# Patient Record
Sex: Female | Born: 1990 | Hispanic: No | Marital: Single | Smoking: Never smoker
Health system: Southern US, Community
[De-identification: ages and names within clinical notes are randomized; demographics above are authoritative.]

## PROBLEM LIST (undated history)

## (undated) HISTORY — PX: NO PAST SURGERIES: SHX2092

---

## 2017-01-08 ENCOUNTER — Ambulatory Visit
Admission: EM | Admit: 2017-01-08 | Discharge: 2017-01-08 | Disposition: A | Discharge status: Home / Self Care | Payer: BLUE CROSS/BLUE SHIELD | Attending: Family Medicine | Admitting: Family Medicine

## 2017-01-08 DIAGNOSIS — R1031 Right lower quadrant pain: Secondary | ICD-10-CM

## 2017-01-08 LAB — URINALYSIS, COMPLETE (UACMP) WITH MICROSCOPIC
BACTERIA UA: NONE SEEN
Bilirubin Urine: NEGATIVE
Glucose, UA: NEGATIVE mg/dL
Hgb urine dipstick: NEGATIVE
Ketones, ur: NEGATIVE mg/dL
Leukocytes, UA: NEGATIVE
NITRITE: NEGATIVE
Protein, ur: NEGATIVE mg/dL
RBC / HPF: NONE SEEN RBC/hpf (ref 0–5)
SPECIFIC GRAVITY, URINE: 1.015 (ref 1.005–1.030)
WBC UA: NONE SEEN WBC/hpf (ref 0–5)
pH: 6.5 (ref 5.0–8.0)

## 2017-01-08 LAB — PREGNANCY, URINE: PREG TEST UR: NEGATIVE

## 2017-01-08 MED ORDER — DICYCLOMINE HCL 20 MG PO TABS: 20.0000 mg | ORAL_TABLET | Freq: Two times a day (BID) | ORAL | 0 refills | Status: DC

## 2017-01-08 NOTE — ED Provider Notes (Signed)
CSN: [id number]     Arrival date & time 01/08/17  1855 History   First MD Initiated Contact with Patient 01/08/17 1934     Chief Complaint  Patient presents with  . Abdominal Pain   (Consider location/radiation/quality/duration/timing/severity/associated sxs/prior Treatment) HPI  26 year old female who presents with bilateral lower pain he has had for about a month. They will come and go sides but is very momentary and never lasts. No nausea vomiting she's had no urinary tract symptoms no vaginal discharge. She's had a normal bowel movement yesterday. Had a  normal period last month and is a little overdue this month but today showed a negative pregnancy test. Denies any fever or chills. She denies any other pain. Noticed some more gas and belching. She tends to eat [employer] frequently. He has not associated the pain with the [employer] food.        History reviewed. No pertinent past medical history. Past Surgical History:  Procedure Laterality Date  . NO PAST SURGERIES     Family History  Problem Relation Age of Onset  . Healthy Mother   . Healthy Father    Social History  Substance Use Topics  . Smoking status: Never Smoker  . Smokeless tobacco: Never Used  . Alcohol use No   OB History    No data available     Review of Systems  Constitutional: Negative for activity change, appetite change, chills, fatigue and fever.  HENT: Negative for congestion.   Gastrointestinal: Positive for abdominal pain. Negative for abdominal distention, anal bleeding, blood in stool, constipation, diarrhea, nausea and vomiting.  Genitourinary: Negative for difficulty urinating, dysuria, menstrual problem, pelvic pain, vaginal bleeding, vaginal discharge and vaginal pain.  Musculoskeletal: Negative for back pain.  All other systems reviewed and are negative.   Allergies  Patient has no known allergies.  Home Medications   Prior to Admission medications   Medication Sig Start Date End  Date Taking? Authorizing Provider  dicyclomine (BENTYL) 20 MG tablet Take 1 tablet (20 mg total) by mouth 2 (two) times daily. 01/08/17   Lutricia Feil, PA-C   Meds Ordered and Administered this Visit  Medications - No data to display  BP 122/73 (BP Location: Left Arm)   Pulse 76   Temp 97.9 F (36.6 C) (Oral)   Resp 16   Ht  (1.575 m)   Wt 130 lb (59 kg)   LMP 12/05/2016   SpO2 100%   BMI 23.78 kg/m  No data found.   Physical Exam  Constitutional: She is oriented to person, place, and time. She appears well-developed and well-nourished. No distress.  HENT:  Head: Normocephalic.  Eyes: Pupils are equal, round, and reactive to light.  Neck: Normal range of motion.  Pulmonary/Chest: Effort normal and breath sounds normal. No respiratory distress. She has no wheezes. She has no rales.  Abdominal: Soft. Bowel sounds are normal. She exhibits no distension and no mass. There is no rebound and no guarding.  The patient was examined with Melissa  ias chaperone and assisted. Patient's tenderness is localized to the right lower quadrant with very deep palpation. Otherwise the abdomen is totally benign.  Musculoskeletal: Normal range of motion.  Neurological: She is alert and oriented to person, place, and time.  Skin: Skin is warm and dry. She is not diaphoretic.  Psychiatric: She has a normal mood and affect. Her behavior is normal. Judgment and thought content normal.  Nursing note and vitals reviewed.  Urgent Care Course     Procedures (including critical care time)  Labs Review Labs Reviewed  URINALYSIS, COMPLETE (UACMP) WITH MICROSCOPIC - Abnormal; Notable for the following:       Result Value   Color, Urine STRAW (*)    Squamous Epithelial / LPF 0-5 (*)    All other components within normal limits  PREGNANCY, URINE    Imaging Review No results found.   Visual Acuity Review  Right Eye Distance:   Left Eye Distance:   Bilateral Distance:    Right Eye  Near:   Left Eye Near:    Bilateral Near:         MDM   1. Right lower quadrant abdominal pain    Discussion with the patient and her mother stating that the exam was benign as was her history. Gaseous foods may be the cause but she has no acute abdomen tonight and further studies are not indicated tonight. I have recommended that she follow-up with a primary care physician and given her several names to contact. If she has worsening of her pain at any time between now and her primary care  Visit, she should go to the emergency room for evaluation.    Lutricia Feil, PA-C 01/08/17 2114

## 2017-01-08 NOTE — ED Triage Notes (Signed)
Patient complains of bilateral lower abdominal pain. Patient states that she has no urinary pain or discomfort, denies any other symptoms.

## 2017-02-13 ENCOUNTER — Encounter: Payer: Self-pay | Admitting: Certified Nurse Midwife

## 2017-02-13 ENCOUNTER — Ambulatory Visit (INDEPENDENT_AMBULATORY_CARE_PROVIDER_SITE_OTHER): Payer: BLUE CROSS/BLUE SHIELD | Admitting: Certified Nurse Midwife

## 2017-02-13 VITALS — BP 116/68 | HR 71 | Ht 62.0 in | Wt 135.6 lb

## 2017-02-13 DIAGNOSIS — Z3201 Encounter for pregnancy test, result positive: Secondary | ICD-10-CM | POA: Diagnosis not present

## 2017-02-13 DIAGNOSIS — Z0000 Encounter for general adult medical examination without abnormal findings: Secondary | ICD-10-CM | POA: Insufficient documentation

## 2017-02-13 DIAGNOSIS — N926 Irregular menstruation, unspecified: Secondary | ICD-10-CM | POA: Diagnosis not present

## 2017-02-13 DIAGNOSIS — Z124 Encounter for screening for malignant neoplasm of cervix: Secondary | ICD-10-CM

## 2017-02-13 LAB — POCT URINE PREGNANCY: Preg Test, Ur: POSITIVE — AB

## 2017-02-13 NOTE — Addendum Note (Signed)
Addended by: Jackquline DenmarkIDGEWAY, Abayomi Pattison W on: 02/13/2017 04:11 PM   Modules accepted: Orders

## 2017-02-13 NOTE — Patient Instructions (Signed)
Preventive Care 18-39 Years, Female Preventive care refers to lifestyle choices and visits with your health care provider that can promote health and wellness. What does preventive care include?  A yearly physical exam. This is also called an annual well check.  Dental exams once or twice a year.  Routine eye exams. Ask your health care provider how often you should have your eyes checked.  Personal lifestyle choices, including:  Daily care of your teeth and gums.  Regular physical activity.  Eating a healthy diet.  Avoiding tobacco and drug use.  Limiting alcohol use.  Practicing safe sex.  Taking vitamin and mineral supplements as recommended by your health care provider. What happens during an annual well check? The services and screenings done by your health care provider during your annual well check will depend on your age, overall health, lifestyle risk factors, and family history of disease. Counseling  Your health care provider may ask you questions about your:  Alcohol use.  Tobacco use.  Drug use.  Emotional well-being.  Home and relationship well-being.  Sexual activity.  Eating habits.  Work and work environment.  Method of birth control.  Menstrual cycle.  Pregnancy history. Screening  You may have the following tests or measurements:  Height, weight, and BMI.  Diabetes screening. This is done by checking your blood sugar (glucose) after you have not eaten for a while (fasting).  Blood pressure.  Lipid and cholesterol levels. These may be checked every 5 years starting at age 20.  Skin check.  Hepatitis C blood test.  Hepatitis B blood test.  Sexually transmitted disease (STD) testing.  BRCA-related cancer screening. This may be done if you have a family history of breast, ovarian, tubal, or peritoneal cancers.  Pelvic exam and Pap test. This may be done every 3 years starting at age 21. Starting at age 30, this may be done every 5  years if you have a Pap test in combination with an HPV test. Discuss your test results, treatment options, and if necessary, the need for more tests with your health care provider. Vaccines  Your health care provider may recommend certain vaccines, such as:  Influenza vaccine. This is recommended every year.  Tetanus, diphtheria, and acellular pertussis (Tdap, Td) vaccine. You may need a Td booster every 10 years.  Varicella vaccine. You may need this if you have not been vaccinated.  HPV vaccine. If you are 26 or younger, you may need three doses over 6 months.  Measles, mumps, and rubella (MMR) vaccine. You may need at least one dose of MMR. You may also need a second dose.  Pneumococcal 13-valent conjugate (PCV13) vaccine. You may need this if you have certain conditions and were not previously vaccinated.  Pneumococcal polysaccharide (PPSV23) vaccine. You may need one or two doses if you smoke cigarettes or if you have certain conditions.  Meningococcal vaccine. One dose is recommended if you are age 19-21 years and a first-year college student living in a residence hall, or if you have one of several medical conditions. You may also need additional booster doses.  Hepatitis A vaccine. You may need this if you have certain conditions or if you travel or work in places where you may be exposed to hepatitis A.  Hepatitis B vaccine. You may need this if you have certain conditions or if you travel or work in places where you may be exposed to hepatitis B.  Haemophilus influenzae type b (Hib) vaccine. You may need this   if you have certain risk factors. Talk to your health care provider about which screenings and vaccines you need and how often you need them. This information is not intended to replace advice given to you by your health care provider. Make sure you discuss any questions you have with your health care provider. Document Released: 10/31/2001 Document Revised: 05/24/2016  Document Reviewed: 07/06/2015 Elsevier Interactive Patient Education  2017 [employer].

## 2017-02-13 NOTE — Progress Notes (Signed)
GYNECOLOGY ANNUAL PREVENTATIVE CARE ENCOUNTER NOTE  Subjective:   Rose Oneal is a 26 y.o. No obstetric history on file. female here for a routine annual gynecologic exam.  Current complaints:None.   Denies abnormal vaginal bleeding, discharge, pelvic pain, problems with intercourse or other gynecologic concerns.  She is currently trying to get pregnant   Gynecologic History Patient's last menstrual period was 01/12/2017 (exact date). Contraception: none Last Pap: N/A.  She has regular cycles every 28-30 days    Obstetric History OB History  No data available    History reviewed. No pertinent past medical history.  Past Surgical History:  Procedure Laterality Date  . NO PAST SURGERIES      Current Outpatient Prescriptions on File Prior to Visit  Medication Sig Dispense Refill  . dicyclomine (BENTYL) 20 MG tablet Take 1 tablet (20 mg total) by mouth 2 (two) times daily. 20 tablet 0   No current facility-administered medications on file prior to visit.     No Known Allergies  Social History   Social History  . Marital status: Single    Spouse name: N/A  . Number of children: N/A  . Years of education: N/A   Occupational History  . Not on file.   Social History Main Topics  . Smoking status: Never Smoker  . Smokeless tobacco: Never Used  . Alcohol use No  . Drug use: No  . Sexual activity: Not on file   Other Topics Concern  . Not on file   Social History Narrative  . No narrative on file    Family History  Problem Relation Age of Onset  . Healthy Mother   . Healthy Father     The following portions of the patient's history were reviewed and updated as appropriate: allergies, current medications, past family history, past medical history, past social history, past surgical history and problem list.  Review of Systems Constitutional: negative Eyes: negative Ears, nose, mouth, throat, and face: negative Respiratory: negative Cardiovascular:  negative Gastrointestinal: negative Genitourinary:negative Integument/breast: negative Hematologic/lymphatic: negative Musculoskeletal:negative Neurological: negative Behavioral/Psych: negative Endocrine: negative Allergic/Immunologic: negative   Objective:  BP 116/68   Pulse 71   Ht 5\' 2"  (1.575 m)   Wt 135 lb 9.6 oz (61.5 kg)   LMP 01/12/2017 (Exact Date)   BMI 24.80 kg/m  CONSTITUTIONAL: Well-developed, well-nourished female in no acute distress.  HENT:  Normocephalic, atraumatic, External right and left ear normal. Oropharynx is clear and moist EYES: Conjunctivae and EOM are normal. Pupils are equal, round, and reactive to light. No scleral icterus.  NECK: Normal range of motion, supple, no masses.  Normal thyroid.  SKIN: Skin is warm and dry. No rash noted. Not diaphoretic. No erythema. No pallor. NEUROLOGIC: Alert and oriented to person, place, and time. Normal reflexes, muscle tone coordination. No cranial nerve deficit noted. PSYCHIATRIC: Normal mood and affect. Normal behavior. Normal judgment and thought content. CARDIOVASCULAR: Normal heart rate noted, regular rhythm RESPIRATORY: Clear to auscultation bilaterally. Effort and breath sounds normal, no problems with respiration noted. BREASTS: Symmetric in size. No masses, skin changes,  or lymphadenopathy. Positive clear nipple discharge on left with breast exam ABDOMEN: Soft, normal bowel sounds, no distention noted.  No tenderness, rebound or guarding.  PELVIC: Normal appearing external genitalia; normal appearing vaginal mucosa and cervix.  No abnormal discharge noted.  Pap smear obtained. uterine size slightly enlarged, no other palpable masses, no uterine or adnexal tenderness. MUSCULOSKELETAL: Normal range of motion. No tenderness.  No cyanosis, clubbing, or edema.  2+ distal pulses.   Assessment:  Annual gynecologic examination with pap smear   Plan:  Will follow up results of pap smear and manage accordingly.  Urine test faint positive. Lidpid profile .Routine preventative health maintenance measures emphasized. Encouraged use of prenatal vitamin. Samples given. Please refer to After Visit Summary for other counseling recommendations.   Doreene BurkeAnnie Vlasta Baskin, CNM

## 2017-02-14 ENCOUNTER — Other Ambulatory Visit: Payer: Self-pay | Admitting: Certified Nurse Midwife

## 2017-02-14 ENCOUNTER — Encounter: Payer: Self-pay | Admitting: Certified Nurse Midwife

## 2017-02-14 DIAGNOSIS — N926 Irregular menstruation, unspecified: Secondary | ICD-10-CM

## 2017-02-14 LAB — LIPID PANEL
CHOL/HDL RATIO: 2.6 ratio (ref 0.0–4.4)
Cholesterol, Total: 193 mg/dL (ref 100–199)
HDL: 73 mg/dL (ref >39)
LDL Calculated: 97 mg/dL (ref 0–99)
Triglycerides: 116 mg/dL (ref 0–149)
VLDL Cholesterol Cal: 23 mg/dL (ref 5–40)

## 2017-02-14 LAB — BETA HCG QUANT (REF LAB): HCG QUANT: 20 m[IU]/mL

## 2017-02-14 NOTE — Progress Notes (Signed)
Beta HCG result 20, follow up for viability of pregnancy.  Rose BurkeAnnie Rakeen Oneal, CNM

## 2017-02-15 LAB — PAP IG W/ RFLX HPV ASCU: PAP Smear Comment: 0

## 2017-02-16 ENCOUNTER — Encounter: Payer: Self-pay | Admitting: Certified Nurse Midwife

## 2017-03-01 ENCOUNTER — Encounter: Payer: Self-pay | Admitting: Certified Nurse Midwife

## 2017-03-05 ENCOUNTER — Ambulatory Visit (INDEPENDENT_AMBULATORY_CARE_PROVIDER_SITE_OTHER): Payer: BLUE CROSS/BLUE SHIELD | Admitting: Certified Nurse Midwife

## 2017-03-05 ENCOUNTER — Ambulatory Visit (INDEPENDENT_AMBULATORY_CARE_PROVIDER_SITE_OTHER): Payer: BLUE CROSS/BLUE SHIELD

## 2017-03-05 VITALS — BP 117/81 | HR 72 | Ht 62.0 in | Wt 135.2 lb

## 2017-03-05 DIAGNOSIS — Z113 Encounter for screening for infections with a predominantly sexual mode of transmission: Secondary | ICD-10-CM

## 2017-03-05 DIAGNOSIS — N926 Irregular menstruation, unspecified: Secondary | ICD-10-CM

## 2017-03-05 DIAGNOSIS — Z1389 Encounter for screening for other disorder: Secondary | ICD-10-CM

## 2017-03-05 DIAGNOSIS — Z3401 Encounter for supervision of normal first pregnancy, first trimester: Secondary | ICD-10-CM

## 2017-03-05 NOTE — Patient Instructions (Signed)
Pregnancy and Zika Virus Disease Zika virus disease, or Zika, is an illness that can spread to people from mosquitoes that carry the virus. It may also spread from person to person through infected body fluids. Zika first occurred in [place], but recently it has spread to new areas. The virus occurs in tropical climates. The location of Zika continues to change. Most people who become infected with Zika virus do not develop serious illness. However, Zika may cause birth defects in an unborn baby whose mother is infected with the virus. It may also increase the risk of miscarriage. What are the symptoms of Zika virus disease? In many cases, people who have been infected with Zika virus do not develop any symptoms. If symptoms appear, they usually start about a week after the person is infected. Symptoms are usually mild. They may include:  Fever.  Rash.  Red eyes.  Joint pain.  How does Zika virus disease spread? The main way that Zika virus spreads is through the bite of a certain type of mosquito. Unlike most types of mosquitos, which bite only at night, the type of mosquito that carries Zika virus bites both at night and during the day. Zika virus can also spread through sexual contact, through a blood transfusion, and from a mother to her baby before or during birth. Once you have had Zika virus disease, it is unlikely that you will get it again. Can I pass Zika to my baby during pregnancy? Yes, Zika can pass from a mother to her baby before or during birth. What problems can Zika cause for my baby? A woman who is infected with Zika virus while pregnant is at risk of having her baby born with a condition in which the brain or head is smaller than expected (microcephaly). Babies who have microcephaly can have developmental delays, seizures, hearing problems, and vision problems. Having Zika virus disease during pregnancy can also increase the risk of miscarriage. How can Zika virus disease be  prevented? There is no vaccine to prevent Zika. The best way to prevent the disease is to avoid infected mosquitoes and avoid exposure to body fluids that can spread the virus. Avoid any possible exposure to Zika by taking the following precautions. For women and their sex partners:  Avoid traveling to high-risk areas. The locations where Zika is being reported change often. To identify high-risk areas, check the CDC travel website: www.cdc.gov/zika/geo/index.html  If you or your sex partner must travel to a high-risk area, talk with a health care provider before and after traveling.  Take all precautions to avoid mosquito bites if you live in, or travel to, any of the high-risk areas. Insect repellents are safe to use during pregnancy.  Ask your health care provider when it is safe to have sexual contact.  For women:  If you are pregnant or trying to become pregnant, avoid sexual contact with persons who may have been exposed to Zika virus, persons who have possible symptoms of Zika, or persons whose history you are unsure about. If you choose to have sexual contact with someone who may have been exposed to Zika virus, use condoms correctly during the entire duration of sexual activity, every time. Do not share sexual devices, as you may be exposed to body fluids.  Ask your health care provider about when it is safe to attempt pregnancy after a possible exposure to Zika virus.  What steps should I take to avoid mosquito bites? Take these steps to avoid mosquito bites   when you are in a high-risk area:  Wear loose clothing that covers your arms and legs.  Limit your outdoor activities.  Do not open windows unless they have window screens.  Sleep under mosquito nets.  Use insect repellent. The best insect repellents have:  DEET, picaridin, oil of lemon eucalyptus (OLE), or IR3535 in them.  Higher amounts of an active ingredient in them.  Remember that insect repellents are safe to  use during pregnancy.  Do not use OLE on children who are younger than 52 years of age. Do not use insect repellent on babies who are younger than 72 months of age.  Cover your child's stroller with mosquito netting. Make sure the netting fits snugly and that any loose netting does not cover your child's mouth or nose. Do not use a blanket as a mosquito-protection cover.  Do not apply insect repellent underneath clothing.  If you are using sunscreen, apply the sunscreen before applying the insect repellent.  Treat clothing with permethrin. Do not apply permethrin directly to your skin. Follow label directions for safe use.  Get rid of standing water, where mosquitoes may reproduce. Standing water is often found in items such as buckets, bowls, animal food dishes, and flowerpots.  When you return from traveling to any high-risk area, continue taking actions to protect yourself against mosquito bites for 3 weeks, even if you show no signs of illness. This will prevent spreading Zika virus to uninfected mosquitoes. What should I know about the sexual transmission of Zika? People can spread Zika to their sexual partners during vaginal, anal, or oral sex, or by sharing sexual devices. Many people with [place] do not develop symptoms, so a person could spread the disease without knowing that they are infected. The greatest risk is to women who are pregnant or who may become pregnant. Zika virus can live longer in semen than it can live in blood. Couples can prevent sexual transmission of the virus by:  Using condoms correctly during the entire duration of sexual activity, every time. This includes vaginal, anal, and oral sex.  Not sharing sexual devices. Sharing increases your risk of being exposed to body fluid from another person.  Avoiding all sexual activity until your health care provider says it is safe.  Should I be tested for Zika virus? A sample of your blood can be tested for Zika virus. A  pregnant woman should be tested if she may have been exposed to the virus or if she has symptoms of Zika. She may also have additional tests done during her pregnancy, such ultrasound testing. Talk with your health care provider about which tests are recommended. This information is not intended to replace advice given to you by your health care provider. Make sure you discuss any questions you have with your health care provider. Document Released: 05/26/2015 Document Revised: 02/10/2016 Document Reviewed: 05/19/2015 Elsevier Interactive Patient Education  2018 [employer]. Hyperemesis Gravidarum Hyperemesis gravidarum is a severe form of nausea and vomiting that happens during pregnancy. Hyperemesis is worse than morning sickness. It may cause you to have nausea or vomiting all day for many days. It may keep you from eating and drinking enough food and liquids. Hyperemesis usually occurs during the first half (the first 20 weeks) of pregnancy. It often goes away once a woman is in her second half of pregnancy. However, sometimes hyperemesis continues through an entire pregnancy. What are the causes? The cause of this condition is not known. It may be related  to changes in chemicals (hormones) in the body during pregnancy, such as the high level of pregnancy hormone (human chorionic gonadotropin) or the increase in the female sex hormone (estrogen). What are the signs or symptoms? Symptoms of this condition include:  Severe nausea and vomiting.  Nausea that does not go away.  Vomiting that does not allow you to keep any food down.  Weight loss.  Body fluid loss (dehydration).  Having no desire to eat, or not liking food that you have previously enjoyed.  How is this diagnosed? This condition may be diagnosed based on:  A physical exam.  Your medical history.  Your symptoms.  Blood tests.  Urine tests.  How is this treated? This condition may be managed with medicine. If  medicines to do not help relieve nausea and vomiting, you may need to receive fluids through an IV tube at the hospital. Follow these instructions at home:  Take over-the-counter and prescription medicines only as told by your health care provider.  Avoid iron pills and multivitamins that contain iron for the first 3-4 months of pregnancy. If you take prescription iron pills, do not stop taking them unless your health care provider approves.  Take the following actions to help prevent nausea and vomiting: ? In the morning, before getting out of bed, try eating a couple of dry crackers or a piece of toast. ? Avoid foods and smells that upset your stomach. Fatty and spicy foods may make nausea worse. ? Eat 5-6 small meals a day. ? Do not drink fluids while eating meals. Drink between meals. ? Eat or suck on things that have ginger in them. Ginger can help relieve nausea. ? Avoid food preparation. The smell of food can spoil your appetite or trigger nausea.  Follow instructions from your health care provider about eating or drinking restrictions.  For snacks, eat high-protein foods, such as cheese.  Keep all follow-up and pre-birth (prenatal) visits as told by your health care provider. This is important. Contact a health care provider if:  You have pain in your abdomen.  You have a severe headache.  You have vision problems.  You are losing weight. Get help right away if:  You cannot drink fluids without vomiting.  You vomit blood.  You have constant nausea and vomiting.  You are very weak.  You are very thirsty.  You feel dizzy.  You faint.  You have a fever or other symptoms that last for more than 2-3 days.  You have a fever and your symptoms suddenly get worse. Summary  Hyperemesis gravidarum is a severe form of nausea and vomiting that happens during pregnancy.  Making some changes to your eating habits may help relieve nausea and vomiting.  This condition may  be managed with medicine.  If medicines to do not help relieve nausea and vomiting, you may need to receive fluids through an IV tube at the hospital. This information is not intended to replace advice given to you by your health care provider. Make sure you discuss any questions you have with your health care provider. Document Released: 09/04/2005 Document Revised: 05/03/2016 Document Reviewed: 05/03/2016 Elsevier Interactive Patient Education  2017 [employer]. Dental Work and Pregnancy Proper dental care before, during, and after pregnancy is important for you and your baby. Pregnancy hormones can sometimes cause the gums to swell, which makes it easier for food to become trapped between teeth. The health of your teeth and gums can affect your growing baby. Dental care  recommendations To help prevent infection and maintain healthy teeth and gums, a thorough oral examination is recommended for all women during the first trimester of pregnancy. Routine cleanings and examinations are recommended throughout pregnancy. Dental care considerations  Tell your dentist if you are pregnant or you plan to become pregnant.  If you are pregnant, avoid routine X-ray exams until after your baby is born. If you are trying to become pregnant, you do not need to avoid X-rays. ? If you need an emergency procedure that includes a dental X-ray exam during pregnancy, very low levels of radiation will be used, and lead aprons can be used to protect you from radiation.  Your dentist will discuss the risks and benefits of having dental procedures during pregnancy. If possible, it is best to have dental procedures (such as cavity fillings and crown repair) during the second trimester of pregnancy or after your baby is born.  If you and your dentist decide to postpone a procedure for any reason, your dentist can recommend treatment to lower the chances of infection until the procedure is performed. This may involve  taking certain medicines that are safe to take during pregnancy, such as penicillin or amoxicillin. Follow these instructions at home:  Practice good oral hygiene habits at home: ? Brush your teeth twice a day with fluoride toothpaste. Brush thoroughly for at least 2 minutes. If you have morning sickness, avoid strongly-flavored toothpastes. ? Floss at least once a day.  Visit your dentist to have regular oral exams and cleanings, and if you experience oral problems.  Eat a well-balanced diet that is low in sugar and carbohydrates.  If you vomit, rinse your mouth with water afterward.  Keep all follow-up visits as told by your dentist. This is important. Seek dental care if:  You develop any of the following oral symptoms or they get worse: ? Pain. ? Bleeding. ? Swelling. ? Inflammation.  You develop growths or swelling between teeth. Get help right away if:  You have a fever or chills. Summary  Proper dental care before, during, and after pregnancy is important for you and your baby.  If you are pregnant, routine X-ray exams should be avoided until after your baby is born.  Your dentist will help you consider the risks and benefits of dental procedures during pregnancy.  You should brush your teeth with fluoride toothpaste twice a day and floss at least once a day. This information is not intended to replace advice given to you by your health care provider. Make sure you discuss any questions you have with your health care provider. Document Released: 02/22/2010 Document Revised: 08/19/2016 Document Reviewed: 08/19/2016 Elsevier Interactive Patient Education  2017 [employer]. First Trimester of Pregnancy The first trimester of pregnancy is from week 1 until the end of week 13 (months 1 through 3). During this time, your baby will begin to develop inside you. At 6-8 weeks, the eyes and face are formed, and the heartbeat can be seen on ultrasound. At the end of 12 weeks, all  the baby's organs are formed. Prenatal care is all the medical care you receive before the birth of your baby. Make sure you get good prenatal care and follow all of your doctor's instructions. Follow these instructions at home: Medicines  Take over-the-counter and prescription medicines only as told by your doctor. Some medicines are safe and some medicines are not safe during pregnancy.  Take a prenatal vitamin that contains at least 600 micrograms (mcg) of folic  acid.  If you have trouble pooping (constipation), take medicine that will make your stool soft (stool softener) if your doctor approves. Eating and drinking  Eat regular, healthy meals.  Your doctor will tell you the amount of weight gain that is right for you.  Avoid raw meat and uncooked cheese.  If you feel sick to your stomach (nauseous) or throw up (vomit): ? Eat 4 or 5 small meals a day instead of 3 large meals. ? Try eating a few soda crackers. ? Drink liquids between meals instead of during meals.  To prevent constipation: ? Eat foods that are high in fiber, like fresh fruits and vegetables, whole grains, and beans. ? Drink enough fluids to keep your pee (urine) clear or pale yellow. Activity  Exercise only as told by your doctor. Stop exercising if you have cramps or pain in your lower belly (abdomen) or low back.  Do not exercise if it is too hot, too humid, or if you are in a place of great height (high altitude).  Try to avoid standing for long periods of time. Move your legs often if you must stand in one place for a long time.  Avoid heavy lifting.  Wear low-heeled shoes. Sit and stand up straight.  You can have sex unless your doctor tells you not to. Relieving pain and discomfort  Wear a good support bra if your breasts are sore.  Take warm water baths (sitz baths) to soothe pain or discomfort caused by hemorrhoids. Use hemorrhoid cream if your doctor says it is okay.  Rest with your legs raised  if you have leg cramps or low back pain.  If you have puffy, bulging veins (varicose veins) in your legs: ? Wear support hose or compression stockings as told by your doctor. ? Raise (elevate) your feet for 15 minutes, 3-4 times a day. ? Limit salt in your food. Prenatal care  Schedule your prenatal visits by the twelfth week of pregnancy.  Write down your questions. Take them to your prenatal visits.  Keep all your prenatal visits as told by your doctor. This is important. Safety  Wear your seat belt at all times when driving.  Make a list of emergency phone numbers. The list should include numbers for family, friends, the hospital, and police and fire departments. General instructions  Ask your doctor for a referral to a local prenatal class. Begin classes no later than at the start of month 6 of your pregnancy.  Ask for help if you need counseling or if you need help with nutrition. Your doctor can give you advice or tell you where to go for help.  Do not use hot tubs, steam rooms, or saunas.  Do not douche or use tampons or scented sanitary pads.  Do not cross your legs for long periods of time.  Avoid all herbs and alcohol. Avoid drugs that are not approved by your doctor.  Do not use any tobacco products, including cigarettes, chewing tobacco, and electronic cigarettes. If you need help quitting, ask your doctor. You may get counseling or other support to help you quit.  Avoid cat litter boxes and soil used by cats. These carry germs that can cause birth defects in the baby and can cause a loss of your baby (miscarriage) or stillbirth.  Visit your dentist. At home, brush your teeth with a soft toothbrush. Be gentle when you floss. Contact a doctor if:  You are dizzy.  You have mild cramps or pressure  in your lower belly.  You have a nagging pain in your belly area.  You continue to feel sick to your stomach, you throw up, or you have watery poop (diarrhea).  You  have a bad smelling fluid coming from your vagina.  You have pain when you pee (urinate).  You have increased puffiness (swelling) in your face, hands, legs, or ankles. Get help right away if:  You have a fever.  You are leaking fluid from your vagina.  You have spotting or bleeding from your vagina.  You have very bad belly cramping or pain.  You gain or lose weight rapidly.  You throw up blood. It may look like coffee grounds.  You are around people who have [place] measles, fifth disease, or chickenpox.  You have a very bad headache.  You have shortness of breath.  You have any kind of trauma, such as from a fall or a car accident. Summary  The first trimester of pregnancy is from week 1 until the end of week 13 (months 1 through 3).  To take care of yourself and your unborn baby, you will need to eat healthy meals, take medicines only if your doctor tells you to do so, and do activities that are safe for you and your baby.  Keep all follow-up visits as told by your doctor. This is important as your doctor will have to ensure that your baby is healthy and growing well. This information is not intended to replace advice given to you by your health care provider. Make sure you discuss any questions you have with your health care provider. Document Released: 02/21/2008 Document Revised: 09/12/2016 Document Reviewed: 09/12/2016 Elsevier Interactive Patient Education  2017 [employer] Commonly Asked Questions During Pregnancy  Cats: A parasite can be excreted in cat feces.  To avoid exposure you need to have another person empty the little box.  If you must empty the litter box you will need to wear gloves.  Wash your hands after handling your cat.  This parasite can also be found in raw or undercooked meat so this should also be avoided.  Colds, Sore Throats, Flu: Please check your medication sheet to see what you can take for symptoms.  If your symptoms are unrelieved by  these medications please call the office.  Dental Work: Most any dental work Agricultural consultantyour dentist recommends is permitted.  X-rays should only be taken during the first trimester if absolutely necessary.  Your abdomen should be shielded with a lead apron during all x-rays.  Please notify your provider prior to receiving any x-rays.  Novocaine is fine; gas is not recommended.  If your dentist requires a note from us prior to dental work please call the office and we will provide one for you.  Exercise: Exercise is an important part of staying healthy during your pregnancy.  You may continue most exercises you were accustomed to prior to pregnancy.  Later in your pregnancy you will most likely notice you have difficulty with activities requiring balance like riding a bicycle.  It is important that you listen to your body and avoid activities that put you at a higher risk of falling.  Adequate rest and staying well hydrated are a must!  If you have questions about the safety of specific activities ask your provider.    Exposure to Children with illness: Try to avoid obvious exposure; report any symptoms to us when noted,  If you have chicken pos, red measles or mumps, you  should be immune to these diseases.   Please do not take any vaccines while pregnant unless you have checked with your OB provider.  Fetal Movement: After 28 weeks we recommend you do "kick counts" twice daily.  Lie or sit down in a calm quiet environment and count your baby movements "kicks".  You should feel your baby at least 10 times per hour.  If you have not felt 10 kicks within the first hour get up, walk around and have something sweet to eat or drink then repeat for an additional hour.  If count remains less than 10 per hour notify your provider.  Fumigating: Follow your pest control agent's advice as to how long to stay out of your home.  Ventilate the area well before re-entering.  Hemorrhoids:   Most over-the-counter preparations can  be used during pregnancy.  Check your medication to see what is safe to use.  It is important to use a stool softener or fiber in your diet and to drink lots of liquids.  If hemorrhoids seem to be getting worse please call the office.   Hot Tubs:  Hot tubs Jacuzzis and saunas are not recommended while pregnant.  These increase your internal body temperature and should be avoided.  Intercourse:  Sexual intercourse is safe during pregnancy as long as you are comfortable, unless otherwise advised by your provider.  Spotting may occur after intercourse; report any bright red bleeding that is heavier than spotting.  Labor:  If you know that you are in labor, please go to the hospital.  If you are unsure, please call the office and let us help you decide what to do.  Lifting, straining, etc:  If your job requires heavy lifting or straining please check with your provider for any limitations.  Generally, you should not lift items heavier than that you can lift simply with your hands and arms (no back muscles)  Painting:  Paint fumes do not harm your pregnancy, but may make you ill and should be avoided if possible.  Latex or water based paints have less odor than oils.  Use adequate ventilation while painting.  Permanents & Hair Color:  Chemicals in hair dyes are not recommended as they cause increase hair dryness which can increase hair loss during pregnancy.  " Highlighting" and permanents are allowed.  Dye may be absorbed differently and permanents may not hold as well during pregnancy.  Sunbathing:  Use a sunscreen, as skin burns easily during pregnancy.  Drink plenty of fluids; avoid over heating.  Tanning Beds:  Because their possible side effects are still unknown, tanning beds are not recommended.  Ultrasound Scans:  Routine ultrasounds are performed at approximately 20 weeks.  You will be able to see your baby's general anatomy an if you would like to know the gender this can usually be  determined as well.  If it is questionable when you conceived you may also receive an ultrasound early in your pregnancy for dating purposes.  Otherwise ultrasound exams are not routinely performed unless there is a medical necessity.  Although you can request a scan we ask that you pay for it when conducted because insurance does not cover " patient request" scans.  Work: If your pregnancy proceeds without complications you may work until your due date, unless your physician or employer advises otherwise.  Round Ligament Pain/Pelvic Discomfort:  Sharp, shooting pains not associated with bleeding are fairly common, usually occurring in the second trimester of pregnancy.  They  tend to be worse when standing up or when you remain standing for long periods of time.  These are the result of pressure of certain pelvic ligaments called "round ligaments".  Rest, Tylenol and heat seem to be the most effective relief.  As the womb and fetus grow, they rise out of the pelvis and the discomfort improves.  Please notify the office if your pain seems different than that described.  It may represent a more serious condition.   

## 2017-03-05 NOTE — Progress Notes (Signed)
Rose CrutchMiranda Hunt presents for NOB nurse interview visit. Pregnancy confirmation done by A. Janee Mornhompson, CNM on 02/13/2017. UPT: positive, bhcg-20. Ultrasound confirmed viable pregnancy today but LMP: 10/19/2016 is not consistent. New EDD: 10/29/2017.  G-1.  P-0000. Pregnancy education material explained and given. No cats in the home. NOB labs ordered.  HIV labs and Drug screen were explained optional and she did not decline. Drug screen ordered. PNV encouraged. Genetic screening options discussed. Genetic testing: Unsure.  Pt may discuss with provider. Pt. To follow up with provider in 5 weeks for NOB physical.  All questions answered. Pt will be moving to [place] in the near future.

## 2017-03-06 LAB — RPR: RPR Ser Ql: NONREACTIVE

## 2017-03-06 LAB — CBC WITH DIFFERENTIAL/PLATELET
BASOS ABS: 0 10*3/uL (ref 0.0–0.2)
Basos: 0 %
EOS (ABSOLUTE): 0 10*3/uL (ref 0.0–0.4)
Eos: 0 %
Hematocrit: 39.4 % (ref 34.0–46.6)
Hemoglobin: 13.2 g/dL (ref 11.1–15.9)
IMMATURE GRANS (ABS): 0 10*3/uL (ref 0.0–0.1)
IMMATURE GRANULOCYTES: 0 %
Lymphocytes Absolute: 1.3 10*3/uL (ref 0.7–3.1)
Lymphs: 14 %
MCH: 29.6 pg (ref 26.6–33.0)
MCHC: 33.5 g/dL (ref 31.5–35.7)
MCV: 88 fL (ref 79–97)
Monocytes Absolute: 0.6 10*3/uL (ref 0.1–0.9)
Monocytes: 7 %
NEUTROS PCT: 79 %
Neutrophils Absolute: 7.2 10*3/uL — ABNORMAL HIGH (ref 1.4–7.0)
PLATELETS: 379 10*3/uL (ref 150–379)
RBC: 4.46 x10E6/uL (ref 3.77–5.28)
RDW: 13.1 % (ref 12.3–15.4)
WBC: 9.3 10*3/uL (ref 3.4–10.8)

## 2017-03-06 LAB — URINALYSIS, ROUTINE W REFLEX MICROSCOPIC
Bilirubin, UA: NEGATIVE
Glucose, UA: NEGATIVE
KETONES UA: NEGATIVE
LEUKOCYTES UA: NEGATIVE
Nitrite, UA: NEGATIVE
Protein, UA: NEGATIVE
RBC, UA: NEGATIVE
SPEC GRAV UA: 1.01 (ref 1.005–1.030)
Urobilinogen, Ur: 0.2 mg/dL (ref 0.2–1.0)
pH, UA: 8 — ABNORMAL HIGH (ref 5.0–7.5)

## 2017-03-06 LAB — MONITOR DRUG PROFILE 14(MW)
AMPHETAMINE SCREEN URINE: NEGATIVE ng/mL
BARBITURATE SCREEN URINE: NEGATIVE ng/mL
BENZODIAZEPINE SCREEN, URINE: NEGATIVE ng/mL
BUPRENORPHINE, URINE: NEGATIVE ng/mL
CANNABINOIDS UR QL SCN: NEGATIVE ng/mL
Cocaine (Metab) Scrn, Ur: NEGATIVE ng/mL
Creatinine(Crt), U: 43.4 mg/dL (ref 20.0–300.0)
Fentanyl, Urine: NEGATIVE pg/mL
Meperidine Screen, Urine: NEGATIVE ng/mL
Methadone Screen, Urine: NEGATIVE ng/mL
OXYCODONE+OXYMORPHONE UR QL SCN: NEGATIVE ng/mL
Opiate Scrn, Ur: NEGATIVE ng/mL
PH UR, DRUG SCRN: 7.2 (ref 4.5–8.9)
PHENCYCLIDINE QUANTITATIVE URINE: NEGATIVE ng/mL
PROPOXYPHENE SCREEN URINE: NEGATIVE ng/mL
SPECIFIC GRAVITY: 1.011
Tramadol Screen, Urine: NEGATIVE ng/mL

## 2017-03-06 LAB — HIV ANTIBODY (ROUTINE TESTING W REFLEX): HIV Screen 4th Generation wRfx: NONREACTIVE

## 2017-03-06 LAB — ANTIBODY SCREEN: ANTIBODY SCREEN: NEGATIVE

## 2017-03-06 LAB — RUBELLA SCREEN: Rubella Antibodies, IGG: 1.02 index (ref >0.99)

## 2017-03-06 LAB — NICOTINE SCREEN, URINE: COTININE UR QL SCN: NEGATIVE ng/mL

## 2017-03-06 LAB — ABO

## 2017-03-06 LAB — HEPATITIS B SURFACE ANTIGEN: HEP B S AG: NEGATIVE

## 2017-03-06 LAB — RH TYPE: RH TYPE: POSITIVE

## 2017-03-06 LAB — VARICELLA ZOSTER ANTIBODY, IGG: Varicella zoster IgG: 1463 index (ref >165)

## 2017-03-07 LAB — CULTURE, OB URINE

## 2017-03-07 LAB — URINE CULTURE, OB REFLEX

## 2017-03-08 LAB — GC/CHLAMYDIA PROBE AMP
Chlamydia trachomatis, NAA: NEGATIVE
NEISSERIA GONORRHOEAE BY PCR: NEGATIVE

## 2017-03-12 ENCOUNTER — Encounter: Payer: Self-pay | Admitting: Certified Nurse Midwife

## 2017-04-02 ENCOUNTER — Encounter: Payer: Self-pay | Admitting: Certified Nurse Midwife

## 2017-04-03 ENCOUNTER — Other Ambulatory Visit: Payer: Self-pay

## 2017-04-03 MED ORDER — PROMETHAZINE HCL 25 MG PO TABS: 25.0000 mg | ORAL_TABLET | Freq: Four times a day (QID) | ORAL | 1 refills | Status: DC | PRN

## 2017-04-09 ENCOUNTER — Ambulatory Visit (INDEPENDENT_AMBULATORY_CARE_PROVIDER_SITE_OTHER): Payer: BLUE CROSS/BLUE SHIELD | Admitting: Certified Nurse Midwife

## 2017-04-09 VITALS — BP 126/69 | HR 90 | Wt 127.8 lb

## 2017-04-09 DIAGNOSIS — Z3401 Encounter for supervision of normal first pregnancy, first trimester: Secondary | ICD-10-CM

## 2017-04-09 LAB — POCT URINALYSIS DIPSTICK
BILIRUBIN UA: NEGATIVE
Glucose, UA: NEGATIVE
Ketones, UA: NEGATIVE
LEUKOCYTES UA: NEGATIVE
NITRITE UA: NEGATIVE
Protein, UA: NEGATIVE
Spec Grav, UA: 1.02 (ref 1.010–1.025)
Urobilinogen, UA: 0.2 E.U./dL
pH, UA: 6 (ref 5.0–8.0)

## 2017-04-09 NOTE — Patient Instructions (Signed)
Common Medications Safe in Pregnancy  Acne:      Constipation:  Benzoyl Peroxide     Colace  Clindamycin      Dulcolax Suppository  Topica Erythromycin     Fibercon  Salicylic Acid      Metamucil         Miralax AVOID:        Senakot   Accutane    Cough:  Retin-A       Cough Drops  Tetracycline      Phenergan w/ Codeine if Rx  Minocycline      Robitussin (Plain & DM)  Antibiotics:     Crabs/Lice:  Ceclor       RID  Cephalosporins    AVOID:  E-Mycins      Kwell  Keflex  Macrobid/Macrodantin   Diarrhea:  Penicillin      Kao-Pectate  Zithromax      Imodium AD         PUSH FLUIDS AVOID:       Cipro     Fever:  Tetracycline      Tylenol (Regular or Extra  Minocycline       Strength)  Levaquin      Extra Strength-Do not          Exceed 8 tabs/24 hrs Caffeine:        <200mg/day (equiv. To 1 cup of coffee or  approx. 3 12 oz sodas)         Gas: Cold/Hayfever:       Gas-X  Benadryl      Mylicon  Claritin       Phazyme  **Claritin-D        Chlor-Trimeton    Headaches:  Dimetapp      ASA-Free Excedrin  Drixoral-Non-Drowsy     Cold Compress  Mucinex (Guaifenasin)     Tylenol (Regular or Extra  Sudafed/Sudafed-12 Hour     Strength)  **Sudafed PE Pseudoephedrine   Tylenol Cold & Sinus     Vicks Vapor Rub  Zyrtec  **AVOID if Problems With Blood Pressure         Heartburn: Avoid lying down for at least 1 hour after meals  Aciphex      Maalox     Rash:  Milk of Magnesia     Benadryl    Mylanta       1% Hydrocortisone Cream  Pepcid  Pepcid Complete   Sleep Aids:  Prevacid      Ambien   Prilosec       Benadryl  Rolaids       Chamomile Tea  Tums (Limit 4/day)     Unisom  Zantac       Tylenol PM         Warm milk-add vanilla or  Hemorrhoids:       Sugar for taste  Anusol/Anusol H.C.  (RX: Analapram 2.5%)  Sugar Substitutes:  Hydrocortisone OTC     Ok in moderation  Preparation H      Tucks        Vaseline lotion applied to tissue with  wiping    Herpes:     Throat:  Acyclovir      Oragel  Famvir  Valtrex     Vaccines:         Flu Shot Leg Cramps:       *Gardasil  Benadryl      Hepatitis A         Hepatitis B Nasal Spray:         Pneumovax  Saline Nasal Spray     Polio Booster         Tetanus Nausea:       Tuberculosis test or PPD  Vitamin B6 25 mg TID   AVOID:    Dramamine      *Gardasil  Emetrol       Live Poliovirus  Ginger Root 250 mg QID    MMR (measles, mumps &  High Complex Carbs @ Bedtime    rebella)  Sea Bands-Accupressure    Varicella (Chickenpox)  Unisom 1/2 tab TID     *No known complications           If received before Pain:         Known pregnancy;   Darvocet       Resume series after  Lortab        Delivery  Percocet    Yeast:   Tramadol      Femstat  Tylenol 3      Gyne-lotrimin  Ultram       Monistat  Vicodin           MISC:         All Sunscreens           Hair Coloring/highlights          Insect Repellant's          (Including DEET)         Mystic Tans How a Baby Grows During Pregnancy Pregnancy begins when a female's sperm enters a female's egg (fertilization). This happens in one of the tubes (fallopian tubes) that connect the ovaries to the womb (uterus). The fertilized egg is called an embryo until it reaches 10 weeks. From 10 weeks until birth, it is called a fetus. The fertilized egg moves down the fallopian tube to the uterus. Then it implants into the lining of the uterus and begins to grow. The developing fetus receives oxygen and nutrients through the pregnant woman's bloodstream and the tissues that grow (placenta) to support the fetus. The placenta is the life support system for the fetus. It provides nutrition and removes waste. Learning as much as you can about your pregnancy and how your baby is developing can help you enjoy the experience. It can also make you aware of when there might be a problem and when to ask questions. How long does a typical pregnancy last? A pregnancy  usually lasts 280 days, or about 40 weeks. Pregnancy is divided into three trimesters:  First trimester: 0-13 weeks.  Second trimester: 14-27 weeks.  Third trimester: 28-40 weeks.  The day when your baby is considered ready to be born (full term) is your estimated date of delivery. How does my baby develop month by month? First month  The fertilized egg attaches to the inside of the uterus.  Some cells will form the placenta. Others will form the fetus.  The arms, legs, brain, spinal cord, lungs, and heart begin to develop.  At the end of the first month, the heart begins to beat.  Second month  The bones, inner ear, eyelids, hands, and feet form.  The genitals develop.  By the end of 8 weeks, all major organs are developing.  Third month  All of the internal organs are forming.  Teeth develop below the gums.  Bones and muscles begin to grow. The spine can flex.  The skin is transparent.  Fingernails and toenails begin to form.  Arms and legs continue to grow  longer, and hands and feet develop.  The fetus is about 3 in (7.6 cm) long.  Fourth month  The placenta is completely formed.  The external sex organs, neck, outer ear, eyebrows, eyelids, and fingernails are formed.  The fetus can hear, swallow, and move its arms and legs.  The kidneys begin to produce urine.  The skin is covered with a white waxy coating (vernix) and very fine hair (lanugo).  Fifth month  The fetus moves around more and can be felt for the first time (quickening).  The fetus starts to sleep and wake up and may begin to suck its finger.  The nails grow to the end of the fingers.  The organ in the digestive system that makes bile (gallbladder) functions and helps to digest the nutrients.  If your baby is a girl, eggs are present in her ovaries. If your baby is a boy, testicles start to move down into his scrotum.  Sixth month  The lungs are formed, but the fetus is not yet  able to breathe.  The eyes open. The brain continues to develop.  Your baby has fingerprints and toe prints. Your baby's hair grows thicker.  At the end of the second trimester, the fetus is about 9 in (22.9 cm) long.  Seventh month  The fetus kicks and stretches.  The eyes are developed enough to sense changes in light.  The hands can make a grasping motion.  The fetus responds to sound.  Eighth month  All organs and body systems are fully developed and functioning.  Bones harden and taste buds develop. The fetus may hiccup.  Certain areas of the brain are still developing. The skull remains soft.  Ninth month  The fetus gains about  lb (0.23 kg) each week.  The lungs are fully developed.  Patterns of sleep develop.  The fetus's head typically moves into a head-down position (vertex) in the uterus to prepare for birth. If the buttocks move into a vertex position instead, the baby is breech.  The fetus weighs 6-9 lbs (2.72-4.08 kg) and is 19-20 in (48.26-50.8 cm) long.  What can I do to have a healthy pregnancy and help my baby develop? Eating and Drinking  Eat a healthy diet. ? Talk with your health care provider to make sure that you are getting the nutrients that you and your baby need. ? Visit www.choosemyplate.gov to learn about creating a healthy diet.  Gain a healthy amount of weight during pregnancy as advised by your health care provider. This is usually 25-35 pounds. You may need to: ? Gain more if you were underweight before getting pregnant or if you are pregnant with more than one baby. ? Gain less if you were overweight or obese when you got pregnant.  Medicines and Vitamins  Take prenatal vitamins as directed by your health care provider. These include vitamins such as folic acid, iron, calcium, and vitamin D. They are important for healthy development.  Take medicines only as directed by your health care provider. Read labels and ask a pharmacist  or your health care provider whether over-the-counter medicines, supplements, and prescription drugs are safe to take during pregnancy.  Activities  Be physically active as advised by your health care provider. Ask your health care provider to recommend activities that are safe for you to do, such as walking or swimming.  Do not participate in strenuous or extreme sports.  Lifestyle  Do not drink alcohol.  Do not use any tobacco   products, including cigarettes, chewing tobacco, or electronic cigarettes. If you need help quitting, ask your health care provider.  Do not use illegal drugs.  Safety  Avoid exposure to mercury, lead, or other heavy metals. Ask your health care provider about common sources of these heavy metals.  Avoid listeria infection during pregnancy. Follow these precautions: ? Do not eat soft cheeses or deli meats. ? Do not eat hot dogs unless they have been warmed up to the point of steaming, such as in the microwave oven. ? Do not drink unpasteurized milk.  Avoid toxoplasmosis infection during pregnancy. Follow these precautions: ? Do not change your cat's litter box, if you have a cat. Ask someone else to do this for you. ? Wear gardening gloves while working in the yard.  General Instructions  Keep all follow-up visits as directed by your health care provider. This is important. This includes prenatal care and screening tests.  Manage any chronic health conditions. Work closely with your health care provider to keep conditions, such as diabetes, under control.  How do I know if my baby is developing well? At each prenatal visit, your health care provider will do several different tests to check on your health and keep track of your baby's development. These include:  Fundal height. ? Your health care provider will measure your growing belly from top to bottom using a tape measure. ? Your health care provider will also feel your belly to determine your  baby's position.  Heartbeat. ? An ultrasound in the first trimester can confirm pregnancy and show a heartbeat, depending on how far along you are. ? Your health care provider will check your baby's heart rate at every prenatal visit. ? As you get closer to your delivery date, you may have regular fetal heart rate monitoring to make sure that your baby is not in distress.  Second trimester ultrasound. ? This ultrasound checks your baby's development. It also indicates your baby's gender.  What should I do if I have concerns about my baby's development? Always talk with your health care provider about any concerns that you may have. This information is not intended to replace advice given to you by your health care provider. Make sure you discuss any questions you have with your health care provider. Document Released: 02/21/2008 Document Revised: 02/10/2016 Document Reviewed: 02/11/2014 Elsevier Interactive Patient Education  2018 [employer]  

## 2017-04-09 NOTE — Progress Notes (Signed)
NEW OB HISTORY AND PHYSICAL  SUBJECTIVE:       Rose Oneal is a 26 y.o. G1P0 female, Patient's last menstrual period was 10/19/2016., Estimated Date of Delivery: 10/29/17, [id number], presents today for establishment of Prenatal Care.She complains of nausea with vomiting for several days . Sample of [place] given.    Gynecologic History Patient's last menstrual period was 10/19/2016. Normal Contraception: none Last Pap: 02/13/17 Results were: normal  Obstetric History OB History  Gravida Para Term Preterm AB Living  1            SAB TAB Ectopic Multiple Live Births               # Outcome Date GA Lbr Len/2nd Weight Sex Delivery Anes PTL Lv  1 Current               No past medical history on file.  Past Surgical History:  Procedure Laterality Date  . NO PAST SURGERIES      Current Outpatient Prescriptions on File Prior to Visit  Medication Sig Dispense Refill  . dicyclomine (BENTYL) 20 MG tablet Take 1 tablet (20 mg total) by mouth 2 (two) times daily. (Patient not taking: Reported on 03/05/2017) 20 tablet 0  . Prenatal Vit-Fe Fumarate-FA (PRENATAL VITAMINS PLUS PO) Take by mouth.    . promethazine (PHENERGAN) 25 MG tablet Take 1 tablet (25 mg total) by mouth every 6 (six) hours as needed for nausea or vomiting. 30 tablet 1   No current facility-administered medications on file prior to visit.     No Known Allergies  Social History   Social History  . Marital status: Single    Spouse name: N/A  . Number of children: N/A  . Years of education: N/A   Occupational History  . Not on file.   Social History Main Topics  . Smoking status: Never Smoker  . Smokeless tobacco: Never Used  . Alcohol use No  . Drug use: No  . Sexual activity: Yes    Partners: Male    Birth control/ protection: None   Other Topics Concern  . Not on file   Social History Narrative  . No narrative on file    Family History  Problem Relation Age of Onset  . Healthy Mother   . Healthy  Father   . Cancer Paternal Grandmother        Lymphoma  . Stroke Paternal Grandmother        great  . Cancer Paternal Aunt        (great) breast cancer    The following portions of the patient's history were reviewed and updated as appropriate: allergies, current medications, past OB history, past medical history, past surgical history, past family history, past social history, and problem list.  OBJECTIVE: Initial Physical Exam (New OB) GENERAL APPEARANCE: alert, well appearing, in no apparent distress, oriented to person, place and time HEAD: normocephalic, atraumatic MOUTH: mucous membranes moist, pharynx normal without lesions THYROID: no thyromegaly or masses present BREASTS: no masses noted, no significant tenderness, no palpable axillary nodes, no skin changes LUNGS: clear to auscultation, no wheezes, rales or rhonchi, symmetric air entry HEART: regular rate and rhythm, no murmurs ABDOMEN: soft, nontender, nondistended, no abnormal masses, no epigastric pain EXTREMITIES: no redness or tenderness in the calves or thighs SKIN: normal coloration and turgor, no rashes LYMPH NODES: no adenopathy palpable NEUROLOGIC: alert, oriented, normal speech, no focal findings or movement disorder noted  PELVIC EXAM EXTERNAL GENITALIA: normal appearing  vulva with no masses, tenderness or lesions VAGINA: no abnormal discharge or lesions CERVIX: no lesions or cervical motion tenderness UTERUS: gravid ADNEXA: no masses palpable and nontender OB EXAM PELVIMETRY: appears adequate RECTUM: exam not indicated  ASSESSMENT: Normal pregnancy  PLAN: New OB counseling: The patient has been given an overview regarding routine prenatal care. Recommendations regarding diet, weight gain, and exercise in pregnancy were given. Prenatal testing, optional genetic testing and carries screening reviewed. PT instructed to schedule appointment in the next 1-2 wks for u/s and blood draw if she would like to have  first trimester screen. Otherwise will follow up if she would like to have AFP, cell free fetal DNA or carrier screening done.  Benefits of Breast Feeding were discussed. The patient is encouraged to consider nursing her baby post partum. Follow up in 4 wks.   Doreene Burke, CNM

## 2017-04-26 ENCOUNTER — Encounter: Payer: Self-pay | Admitting: Certified Nurse Midwife

## 2017-04-27 ENCOUNTER — Other Ambulatory Visit: Payer: Self-pay | Admitting: Certified Nurse Midwife

## 2017-04-27 MED ORDER — ONDANSETRON 4 MG PO TBDP: 4.0000 mg | ORAL_TABLET | Freq: Four times a day (QID) | ORAL | 0 refills | Status: DC | PRN

## 2017-04-30 ENCOUNTER — Encounter: Payer: Self-pay | Admitting: Certified Nurse Midwife

## 2017-05-07 ENCOUNTER — Encounter: Payer: Self-pay | Admitting: Certified Nurse Midwife

## 2017-05-07 ENCOUNTER — Ambulatory Visit (INDEPENDENT_AMBULATORY_CARE_PROVIDER_SITE_OTHER): Payer: BLUE CROSS/BLUE SHIELD | Admitting: Certified Nurse Midwife

## 2017-05-07 VITALS — BP 98/61 | HR 84 | Wt 126.9 lb

## 2017-05-07 DIAGNOSIS — O219 Vomiting of pregnancy, unspecified: Secondary | ICD-10-CM

## 2017-05-07 DIAGNOSIS — Z3689 Encounter for other specified antenatal screening: Secondary | ICD-10-CM

## 2017-05-07 DIAGNOSIS — Z3402 Encounter for supervision of normal first pregnancy, second trimester: Secondary | ICD-10-CM

## 2017-05-07 LAB — POCT URINALYSIS DIPSTICK
BILIRUBIN UA: NEGATIVE
GLUCOSE UA: NEGATIVE
Ketones, UA: NEGATIVE
Leukocytes, UA: NEGATIVE
Nitrite, UA: NEGATIVE
Protein, UA: NEGATIVE
SPEC GRAV UA: 1.015 (ref 1.010–1.025)
Urobilinogen, UA: 0.2 E.U./dL
pH, UA: 7 (ref 5.0–8.0)

## 2017-05-07 NOTE — Patient Instructions (Signed)

## 2017-05-07 NOTE — Progress Notes (Signed)
ROB- no complaints. Unsure of 2nd trimester screen.

## 2017-05-11 ENCOUNTER — Encounter: Payer: Self-pay | Admitting: Certified Nurse Midwife

## 2017-05-11 ENCOUNTER — Other Ambulatory Visit: Payer: Self-pay | Admitting: Certified Nurse Midwife

## 2017-05-11 MED ORDER — ONDANSETRON 4 MG PO TBDP: 4.0000 mg | ORAL_TABLET | Freq: Four times a day (QID) | ORAL | 0 refills | Status: AC | PRN

## 2017-05-11 NOTE — Progress Notes (Unsigned)
Pt requesting refill for zofran.  Doreene Burke, CNM

## 2017-05-13 MED ORDER — PROMETHAZINE HCL 25 MG RE SUPP: 25.0000 mg | Freq: Four times a day (QID) | RECTAL | 0 refills | Status: DC | PRN

## 2017-05-13 NOTE — Progress Notes (Signed)
ROB-Reports nausea with intermittent vomiting. Taking Zofran. Encouraged patient to maximize usage. Rx Phenergan, see orders. Declines second trimester screen at time time. Patient relocating to [facility]. Advised location of new provider. List of practice locations given: [facility], [facility], [facility], and [facility]. Encouraged patient to transfer care prior to [redacted] weeks gestation. Reviewed red flag symptoms and when to call. RTC x 4-5 weeks for anatomy scan and ROB.

## 2017-06-08 ENCOUNTER — Ambulatory Visit (INDEPENDENT_AMBULATORY_CARE_PROVIDER_SITE_OTHER): Payer: BLUE CROSS/BLUE SHIELD | Admitting: Certified Nurse Midwife

## 2017-06-08 ENCOUNTER — Ambulatory Visit (INDEPENDENT_AMBULATORY_CARE_PROVIDER_SITE_OTHER): Payer: BLUE CROSS/BLUE SHIELD

## 2017-06-08 VITALS — BP 127/66 | HR 86 | Wt 125.9 lb

## 2017-06-08 DIAGNOSIS — Z3689 Encounter for other specified antenatal screening: Secondary | ICD-10-CM

## 2017-06-08 DIAGNOSIS — Z3402 Encounter for supervision of normal first pregnancy, second trimester: Secondary | ICD-10-CM | POA: Diagnosis not present

## 2017-06-08 DIAGNOSIS — Z3492 Encounter for supervision of normal pregnancy, unspecified, second trimester: Secondary | ICD-10-CM

## 2017-06-08 LAB — POCT URINALYSIS DIPSTICK
Bilirubin, UA: NEGATIVE
Glucose, UA: NEGATIVE
Ketones, UA: NEGATIVE
LEUKOCYTES UA: NEGATIVE
NITRITE UA: NEGATIVE
PH UA: 6 (ref 5.0–8.0)
Protein, UA: NEGATIVE
RBC UA: NEGATIVE
Spec Grav, UA: 1.015 (ref 1.010–1.025)
UROBILINOGEN UA: 0.2 U/dL

## 2017-06-08 NOTE — Patient Instructions (Signed)
Round Ligament Pain The round ligament is a cord of muscle and tissue that helps to support the uterus. It can become a source of pain during pregnancy if it becomes stretched or twisted as the baby grows. The pain usually begins in the second trimester of pregnancy, and it can come and go until the baby is delivered. It is not a serious problem, and it does not cause harm to the baby. Round ligament pain is usually a short, sharp, and pinching pain, but it can also be a dull, lingering, and aching pain. The pain is felt in the lower side of the abdomen or in the groin. It usually starts deep in the groin and moves up to the outside of the hip area. Pain can occur with:  A sudden change in position.  Rolling over in bed.  Coughing or sneezing.  Physical activity.  Follow these instructions at home: Watch your condition for any changes. Take these steps to help with your pain:  When the pain starts, relax. Then try: ? Sitting down. ? Flexing your knees up to your abdomen. ? Lying on your side with one pillow under your abdomen and another pillow between your legs. ? Sitting in a warm bath for 15-20 minutes or until the pain goes away.  Take over-the-counter and prescription medicines only as told by your health care provider.  Move slowly when you sit and stand.  Avoid long walks if they cause pain.  Stop or lessen your physical activities if they cause pain.  Contact a health care provider if:  Your pain does not go away with treatment.  You feel pain in your back that you did not have before.  Your medicine is not helping. Get help right away if:  You develop a fever or chills.  You develop uterine contractions.  You develop vaginal bleeding.  You develop nausea or vomiting.  You develop diarrhea.  You have pain when you urinate. This information is not intended to replace advice given to you by your health care provider. Make sure you discuss any questions you have  with your health care provider. Document Released: 06/13/2008 Document Revised: 02/10/2016 Document Reviewed: 11/11/2014 Elsevier Interactive Patient Education  2018 [employer] How a Baby Grows During Pregnancy Pregnancy begins when a female's sperm enters a female's egg (fertilization). This happens in one of the tubes (fallopian tubes) that connect the ovaries to the womb (uterus). The fertilized egg is called an embryo until it reaches 10 weeks. From 10 weeks until birth, it is called a fetus. The fertilized egg moves down the fallopian tube to the uterus. Then it implants into the lining of the uterus and begins to grow. The developing fetus receives oxygen and nutrients through the pregnant woman's bloodstream and the tissues that grow (placenta) to support the fetus. The placenta is the life support system for the fetus. It provides nutrition and removes waste. Learning as much as you can about your pregnancy and how your baby is developing can help you enjoy the experience. It can also make you aware of when there might be a problem and when to ask questions. How long does a typical pregnancy last? A pregnancy usually lasts 280 days, or about 40 weeks. Pregnancy is divided into three trimesters:  First trimester: 0-13 weeks.  Second trimester: 14-27 weeks.  Third trimester: 28-40 weeks.  The day when your baby is considered ready to be born (full term) is your estimated date of delivery. How does   my baby develop month by month? First month  The fertilized egg attaches to the inside of the uterus.  Some cells will form the placenta. Others will form the fetus.  The arms, legs, brain, spinal cord, lungs, and heart begin to develop.  At the end of the first month, the heart begins to beat.  Second month  The bones, inner ear, eyelids, hands, and feet form.  The genitals develop.  By the end of 8 weeks, all major organs are developing.  Third month  All of the internal  organs are forming.  Teeth develop below the gums.  Bones and muscles begin to grow. The spine can flex.  The skin is transparent.  Fingernails and toenails begin to form.  Arms and legs continue to grow longer, and hands and feet develop.  The fetus is about 3 in (7.6 cm) long.  Fourth month  The placenta is completely formed.  The external sex organs, neck, outer ear, eyebrows, eyelids, and fingernails are formed.  The fetus can hear, swallow, and move its arms and legs.  The kidneys begin to produce urine.  The skin is covered with a white waxy coating (vernix) and very fine hair (lanugo).  Fifth month  The fetus moves around more and can be felt for the first time (quickening).  The fetus starts to sleep and wake up and may begin to suck its finger.  The nails grow to the end of the fingers.  The organ in the digestive system that makes bile (gallbladder) functions and helps to digest the nutrients.  If your baby is a girl, eggs are present in her ovaries. If your baby is a boy, testicles start to move down into his scrotum.  Sixth month  The lungs are formed, but the fetus is not yet able to breathe.  The eyes open. The brain continues to develop.  Your baby has fingerprints and toe prints. Your baby's hair grows thicker.  At the end of the second trimester, the fetus is about 9 in (22.9 cm) long.  Seventh month  The fetus kicks and stretches.  The eyes are developed enough to sense changes in light.  The hands can make a grasping motion.  The fetus responds to sound.  Eighth month  All organs and body systems are fully developed and functioning.  Bones harden and taste buds develop. The fetus may hiccup.  Certain areas of the brain are still developing. The skull remains soft.  Ninth month  The fetus gains about  lb (0.23 kg) each week.  The lungs are fully developed.  Patterns of sleep develop.  The fetus's head typically moves into a  head-down position (vertex) in the uterus to prepare for birth. If the buttocks move into a vertex position instead, the baby is breech.  The fetus weighs 6-9 lbs (2.72-4.08 kg) and is 19-20 in (48.26-50.8 cm) long.  What can I do to have a healthy pregnancy and help my baby develop? Eating and Drinking  Eat a healthy diet. ? Talk with your health care provider to make sure that you are getting the nutrients that you and your baby need. ? Visit www.choosemyplate.gov to learn about creating a healthy diet.  Gain a healthy amount of weight during pregnancy as advised by your health care provider. This is usually 25-35 pounds. You may need to: ? Gain more if you were underweight before getting pregnant or if you are pregnant with more than one baby. ? Gain less   if you were overweight or obese when you got pregnant.  Medicines and Vitamins  Take prenatal vitamins as directed by your health care provider. These include vitamins such as folic acid, iron, calcium, and vitamin D. They are important for healthy development.  Take medicines only as directed by your health care provider. Read labels and ask a pharmacist or your health care provider whether over-the-counter medicines, supplements, and prescription drugs are safe to take during pregnancy.  Activities  Be physically active as advised by your health care provider. Ask your health care provider to recommend activities that are safe for you to do, such as walking or swimming.  Do not participate in strenuous or extreme sports.  Lifestyle  Do not drink alcohol.  Do not use any tobacco products, including cigarettes, chewing tobacco, or electronic cigarettes. If you need help quitting, ask your health care provider.  Do not use illegal drugs.  Safety  Avoid exposure to mercury, lead, or other heavy metals. Ask your health care provider about common sources of these heavy metals.  Avoid listeria infection during pregnancy. Follow  these precautions: ? Do not eat soft cheeses or deli meats. ? Do not eat hot dogs unless they have been warmed up to the point of steaming, such as in the microwave oven. ? Do not drink unpasteurized milk.  Avoid toxoplasmosis infection during pregnancy. Follow these precautions: ? Do not change your cat's litter box, if you have a cat. Ask someone else to do this for you. ? Wear gardening gloves while working in the yard.  General Instructions  Keep all follow-up visits as directed by your health care provider. This is important. This includes prenatal care and screening tests.  Manage any chronic health conditions. Work closely with your health care provider to keep conditions, such as diabetes, under control.  How do I know if my baby is developing well? At each prenatal visit, your health care provider will do several different tests to check on your health and keep track of your baby's development. These include:  Fundal height. ? Your health care provider will measure your growing belly from top to bottom using a tape measure. ? Your health care provider will also feel your belly to determine your baby's position.  Heartbeat. ? An ultrasound in the first trimester can confirm pregnancy and show a heartbeat, depending on how far along you are. ? Your health care provider will check your baby's heart rate at every prenatal visit. ? As you get closer to your delivery date, you may have regular fetal heart rate monitoring to make sure that your baby is not in distress.  Second trimester ultrasound. ? This ultrasound checks your baby's development. It also indicates your baby's gender.  What should I do if I have concerns about my baby's development? Always talk with your health care provider about any concerns that you may have. This information is not intended to replace advice given to you by your health care provider. Make sure you discuss any questions you have with your health  care provider. Document Released: 02/21/2008 Document Revised: 02/10/2016 Document Reviewed: 02/11/2014 Elsevier Interactive Patient Education  2018 [employer]  

## 2017-06-08 NOTE — Progress Notes (Signed)
ROB- pt is doing well, had anatomy scan done today

## 2017-06-08 NOTE — Progress Notes (Signed)
  ROB and anatomy scan today. Scan was normal and complete ( see below). Reviewed results with pt. She will follow up in 4 wks.   Doreene Burke, CNM ULTRASOUND REPORT  Location: [facility] Date of Service: 06/08/17  Indications:Anatomy U/S Findings:  Mason Jim intrauterine pregnancy is visualized with FHR at 143 BPM. Biometrics give an (U/S) Gestational age of 80 6/7 weeks and an (U/S) EDD of 10/27/17; this correlates with the clinically established EDD of 10/29/17.  Fetal presentation is Vertex.  EFW: 318g (11oz). Placenta: Fundal, grade 0, 6.9 cm from internal os. AFI: Adequate with MVP of 3.9 cm.  Anatomic survey is complete and normal; Gender - female  .   Ovaries are not seen Survey of the adnexa demonstrates no adnexal masses. There is no free peritoneal fluid in the cul de sac.  Impression: 1. 19 6/7 week Viable Singleton Intrauterine pregnancy by U/S. 2. (U/S) EDD is consistent with Clinically established (LMP) EDD of 10/29/17. 3. Normal Anatomy Scan  Recommendations: 1.Clinical correlation with the patient's History and Physical Exam.   Boyce Medici

## 2017-06-15 ENCOUNTER — Telehealth: Payer: Self-pay | Admitting: Certified Nurse Midwife

## 2017-06-15 NOTE — Telephone Encounter (Signed)
Called patient, verified full name and date of birth.   Patient relocating to [place] and needs advice on practices in town. Patient will be living in the [place] area. Information given for [facility] and [facility]. Encouraged patient to contact office and schedule appointment then complete records release form.   Call back with further needs, questions, or concerns.    Serafina Royals, CNM

## 2017-07-06 ENCOUNTER — Ambulatory Visit (INDEPENDENT_AMBULATORY_CARE_PROVIDER_SITE_OTHER): Payer: BLUE CROSS/BLUE SHIELD | Admitting: Obstetrics and Gynecology

## 2017-07-06 VITALS — BP 112/73 | HR 79 | Wt 129.0 lb

## 2017-07-06 DIAGNOSIS — Z23 Encounter for immunization: Secondary | ICD-10-CM | POA: Diagnosis not present

## 2017-07-06 DIAGNOSIS — Z3492 Encounter for supervision of normal pregnancy, unspecified, second trimester: Secondary | ICD-10-CM

## 2017-07-06 LAB — POCT URINALYSIS DIPSTICK
BILIRUBIN UA: NEGATIVE
Blood, UA: NEGATIVE
GLUCOSE UA: NEGATIVE
Ketones, UA: NEGATIVE
LEUKOCYTES UA: NEGATIVE
Nitrite, UA: NEGATIVE
PH UA: 7.5 (ref 5.0–8.0)
Protein, UA: NEGATIVE
Spec Grav, UA: 1.01 (ref 1.010–1.025)
Urobilinogen, UA: 0.2 E.U./dL

## 2017-07-06 NOTE — Progress Notes (Signed)
ROB- pt is doing ok, having some nausea, flu vaccine given

## 2017-07-06 NOTE — Progress Notes (Signed)
ROB- flu vaccine given,still nauseated- bonjesta sample given.already moved to [place] and is transferring care there.

## 2017-07-17 ENCOUNTER — Encounter: Payer: Self-pay | Admitting: Certified Nurse Midwife

## 2017-08-03 ENCOUNTER — Ambulatory Visit (INDEPENDENT_AMBULATORY_CARE_PROVIDER_SITE_OTHER): Payer: BLUE CROSS/BLUE SHIELD | Admitting: Certified Nurse Midwife

## 2017-08-03 ENCOUNTER — Other Ambulatory Visit: Payer: BLUE CROSS/BLUE SHIELD

## 2017-08-03 VITALS — BP 114/76 | HR 84 | Wt 129.4 lb

## 2017-08-03 DIAGNOSIS — Z130 Encounter for screening for diseases of the blood and blood-forming organs and certain disorders involving the immune mechanism: Secondary | ICD-10-CM

## 2017-08-03 DIAGNOSIS — Z131 Encounter for screening for diabetes mellitus: Secondary | ICD-10-CM

## 2017-08-03 DIAGNOSIS — Z3492 Encounter for supervision of normal pregnancy, unspecified, second trimester: Secondary | ICD-10-CM

## 2017-08-03 DIAGNOSIS — Z23 Encounter for immunization: Secondary | ICD-10-CM

## 2017-08-03 LAB — POCT URINALYSIS DIPSTICK
Bilirubin, UA: NEGATIVE
GLUCOSE UA: NEGATIVE
Ketones, UA: NEGATIVE
Leukocytes, UA: NEGATIVE
Nitrite, UA: NEGATIVE
Protein, UA: NEGATIVE
RBC UA: NEGATIVE
SPEC GRAV UA: 1.015 (ref 1.010–1.025)
UROBILINOGEN UA: 0.2 U/dL
pH, UA: 6.5 (ref 5.0–8.0)

## 2017-08-03 NOTE — Patient Instructions (Signed)
Common Medications Safe in Pregnancy  Acne:      Constipation:  Benzoyl Peroxide     Colace  Clindamycin      Dulcolax Suppository  Topica Erythromycin     Fibercon  Salicylic Acid      Metamucil         Miralax AVOID:        Senakot   Accutane    Cough:  Retin-A       Cough Drops  Tetracycline      Phenergan w/ Codeine if Rx  Minocycline      Robitussin (Plain & DM)  Antibiotics:     Crabs/Lice:  Ceclor       RID  Cephalosporins    AVOID:  E-Mycins      Kwell  Keflex  Macrobid/Macrodantin   Diarrhea:  Penicillin      Kao-Pectate  Zithromax      Imodium AD         PUSH FLUIDS AVOID:       Cipro     Fever:  Tetracycline      Tylenol (Regular or Extra  Minocycline       Strength)  Levaquin      Extra Strength-Do not          Exceed 8 tabs/24 hrs Caffeine:        <200mg/day (equiv. To 1 cup of coffee or  approx. 3 12 oz sodas)         Gas: Cold/Hayfever:       Gas-X  Benadryl      Mylicon  Claritin       Phazyme  **Claritin-D        Chlor-Trimeton    Headaches:  Dimetapp      ASA-Free Excedrin  Drixoral-Non-Drowsy     Cold Compress  Mucinex (Guaifenasin)     Tylenol (Regular or Extra  Sudafed/Sudafed-12 Hour     Strength)  **Sudafed PE Pseudoephedrine   Tylenol Cold & Sinus     Vicks Vapor Rub  Zyrtec  **AVOID if Problems With Blood Pressure         Heartburn: Avoid lying down for at least 1 hour after meals  Aciphex      Maalox     Rash:  Milk of Magnesia     Benadryl    Mylanta       1% Hydrocortisone Cream  Pepcid  Pepcid Complete   Sleep Aids:  Prevacid      Ambien   Prilosec       Benadryl  Rolaids       Chamomile Tea  Tums (Limit 4/day)     Unisom  Zantac       Tylenol PM         Warm milk-add vanilla or  Hemorrhoids:       Sugar for taste  Anusol/Anusol H.C.  (RX: Analapram 2.5%)  Sugar Substitutes:  Hydrocortisone OTC     Ok in moderation  Preparation H      Tucks        Vaseline lotion applied to tissue with  wiping    Herpes:     Throat:  Acyclovir      Oragel  Famvir  Valtrex     Vaccines:         Flu Shot Leg Cramps:       *Gardasil  Benadryl      Hepatitis A         Hepatitis B Nasal Spray:         Pneumovax  Saline Nasal Spray     Polio Booster         Tetanus Nausea:       Tuberculosis test or PPD  Vitamin B6 25 mg TID   AVOID:    Dramamine      *Gardasil  Emetrol       Live Poliovirus  Ginger Root 250 mg QID    MMR (measles, mumps &  High Complex Carbs @ Bedtime    rebella)  Sea Bands-Accupressure    Varicella (Chickenpox)  Unisom 1/2 tab TID     *No known complications           If received before Pain:         Known pregnancy;   Darvocet       Resume series after  Lortab        Delivery  Percocet    Yeast:   Tramadol      Femstat  Tylenol 3      Gyne-lotrimin  Ultram       Monistat  Vicodin           MISC:         All Sunscreens           Hair Coloring/highlights          Insect Repellant's          (Including DEET)         [employer] Cord Blood Banking Information Cord blood banking is the process of collecting and storing the blood that is in the umbilical cord and placenta at the time of delivery. This blood contains stem cells, which can be used to treat many blood diseases, immune system disorders, and childhood cancers. Stem cells can also be used to research certain diseases and treatments. Many people who choose cord blood banking donate the blood. Donated blood can be used in lifesaving treatments or for research. Other people choose to store the blood privately. Blood that is stored privately can only be used with the person's permission. This option is often chosen if:  A family member needs a stem cell transplant.  The child is part of an ethnic minority.  The child was conceived through in vitro fertilization.  What should I look for in a blood bank? A blood bank is the organization that coordinates cord blood banking. Make sure the cord blood bank that  you use:  Is accredited.  Is financially stable.  Handles a large volume of cord blood samples.  Has a procedure in place for transport and storage.  Allows you the option of transferring your cord blood sample.  Has a procedure in place if the bank goes out of business.  Clearly states all costs and limits to future costs.  People who choose to donate cord blood should not need to pay for blood banking. People who keep the blood for private use will need to pay for the first (initial) storage and pay a fee each year (annual fee). Other fees may also apply. What are the risks of cord blood banking? There are no health risks associated with cord blood banking. It is considered safe. How should I prepare? You must schedule this process at least 4-6 weeks before you will be giving birth. How is the blood collected? The blood is collected as soon as the baby has been delivered. Within 15 minutes of delivery, a health care provider will take these actions to collect the blood:  Clamp the  umbilical cord at the top and bottom. This traps the blood in the umbilical cord.  Use a syringe or bag to collect the blood.  Insert needles into the placenta to collect (draw out) more blood.  What happens after the blood is collected? After the blood has been collected:  The blood will be sent to a blood bank.  The blood will be tested for genetic problems and infectious diseases. If the blood tests positive for a genetic problem or a disease, someone will contact you and let you know.  The blood will be frozen.  If your child develops a genetic condition, immune system disorder, or cancer, you will be responsible for contacting the blood bank and letting them know. This information is not intended to replace advice given to you by your health care provider. Make sure you discuss any questions you have with your health care provider. Document Released: 02/22/2010 Document Revised: 02/10/2016  Document Reviewed: 02/22/2015 Elsevier Interactive Patient Education  2018 [employer]. Breastfeeding Deciding to breastfeed is one of the best choices you can make for you and your baby. A change in hormones during pregnancy causes your breast tissue to grow and increases the number and size of your milk ducts. These hormones also allow proteins, sugars, and fats from your blood supply to make breast milk in your milk-producing glands. Hormones prevent breast milk from being released before your baby is born as well as prompt milk flow after birth. Once breastfeeding has begun, thoughts of your baby, as well as his or her sucking or crying, can stimulate the release of milk from your milk-producing glands. Benefits of breastfeeding For Your Baby  Your first milk (colostrum) helps your baby's digestive system function better.  There are antibodies in your milk that help your baby fight off infections.  Your baby has a lower incidence of asthma, allergies, and sudden infant death syndrome.  The nutrients in breast milk are better for your baby than infant formulas and are designed uniquely for your baby's needs.  Breast milk improves your baby's brain development.  Your baby is less likely to develop other conditions, such as childhood obesity, asthma, or type 2 diabetes mellitus.  For You  Breastfeeding helps to create a very special bond between you and your baby.  Breastfeeding is convenient. Breast milk is always available at the correct temperature and costs nothing.  Breastfeeding helps to burn calories and helps you lose the weight gained during pregnancy.  Breastfeeding makes your uterus contract to its prepregnancy size faster and slows bleeding (lochia) after you give birth.  Breastfeeding helps to lower your risk of developing type 2 diabetes mellitus, osteoporosis, and breast or ovarian cancer later in life.  Signs that your baby is hungry Early Signs of  Hunger  Increased alertness or activity.  Stretching.  Movement of the head from side to side.  Movement of the head and opening of the mouth when the corner of the mouth or cheek is stroked (rooting).  Increased sucking sounds, smacking lips, cooing, sighing, or squeaking.  Hand-to-mouth movements.  Increased sucking of fingers or hands.  Late Signs of Hunger  Fussing.  Intermittent crying.  Extreme Signs of Hunger Signs of extreme hunger will require calming and consoling before your baby will be able to breastfeed successfully. Do not wait for the following signs of extreme hunger to occur before you initiate breastfeeding:  Restlessness.  A loud, strong cry.  Screaming.  Breastfeeding basics Breastfeeding Initiation  Find a  comfortable place to sit or lie down, with your neck and back well supported.  Place a pillow or rolled up blanket under your baby to bring him or her to the level of your breast (if you are seated). Nursing pillows are specially designed to help support your arms and your baby while you breastfeed.  Make sure that your baby's abdomen is facing your abdomen.  Gently massage your breast. With your fingertips, massage from your chest wall toward your nipple in a circular motion. This encourages milk flow. You may need to continue this action during the feeding if your milk flows slowly.  Support your breast with 4 fingers underneath and your thumb above your nipple. Make sure your fingers are well away from your nipple and your baby's mouth.  Stroke your baby's lips gently with your finger or nipple.  When your baby's mouth is open wide enough, quickly bring your baby to your breast, placing your entire nipple and as much of the colored area around your nipple (areola) as possible into your baby's mouth. ? More areola should be visible above your baby's upper lip than below the lower lip. ? Your baby's tongue should be between his or her lower gum  and your breast.  Ensure that your baby's mouth is correctly positioned around your nipple (latched). Your baby's lips should create a seal on your breast and be turned out (everted).  It is common for your baby to suck about 2-3 minutes in order to start the flow of breast milk.  Latching Teaching your baby how to latch on to your breast properly is very important. An improper latch can cause nipple pain and decreased milk supply for you and poor weight gain in your baby. Also, if your baby is not latched onto your nipple properly, he or she may swallow some air during feeding. This can make your baby fussy. Burping your baby when you switch breasts during the feeding can help to get rid of the air. However, teaching your baby to latch on properly is still the best way to prevent fussiness from swallowing air while breastfeeding. Signs that your baby has successfully latched on to your nipple:  Silent tugging or silent sucking, without causing you pain.  Swallowing heard between every 3-4 sucks.  Muscle movement above and in front of his or her ears while sucking.  Signs that your baby has not successfully latched on to nipple:  Sucking sounds or smacking sounds from your baby while breastfeeding.  Nipple pain.  If you think your baby has not latched on correctly, slip your finger into the corner of your baby's mouth to break the suction and place it between your baby's gums. Attempt breastfeeding initiation again. Signs of Successful Breastfeeding Signs from your baby:  A gradual decrease in the number of sucks or complete cessation of sucking.  Falling asleep.  Relaxation of his or her body.  Retention of a small amount of milk in his or her mouth.  Letting go of your breast by himself or herself.  Signs from you:  Breasts that have increased in firmness, weight, and size 1-3 hours after feeding.  Breasts that are softer immediately after breastfeeding.  Increased milk  volume, as well as a change in milk consistency and color by the fifth day of breastfeeding.  Nipples that are not sore, cracked, or bleeding.  Signs That Your [employer] is Getting Enough Milk  Wetting at least 1-2 diapers during the first 24 hours after  birth.  Wetting at least 5-6 diapers every 24 hours for the first week after birth. The urine should be clear or pale yellow by 5 days after birth.  Wetting 6-8 diapers every 24 hours as your baby continues to grow and develop.  At least 3 stools in a 24-hour period by age 67 days. The stool should be soft and yellow.  At least 3 stools in a 24-hour period by age 51 days. The stool should be seedy and yellow.  No loss of weight greater than 10% of birth weight during the first 77 days of age.  Average weight gain of 4-7 ounces (113-198 g) per week after age 106 days.  Consistent daily weight gain by age 679 days, without weight loss after the age of 2 weeks.  After a feeding, your baby may spit up a small amount. This is common. Breastfeeding frequency and duration Frequent feeding will help you make more milk and can prevent sore nipples and breast engorgement. Breastfeed when you feel the need to reduce the fullness of your breasts or when your baby shows signs of hunger. This is called "breastfeeding on demand." Avoid introducing a pacifier to your baby while you are working to establish breastfeeding (the first 4-6 weeks after your baby is born). After this time you may choose to use a pacifier. Research has shown that pacifier use during the first year of a baby's life decreases the risk of sudden infant death syndrome (SIDS). Allow your baby to feed on each breast as long as he or she wants. Breastfeed until your baby is finished feeding. When your baby unlatches or falls asleep while feeding from the first breast, offer the second breast. Because newborns are often sleepy in the first few weeks of life, you may need to awaken your baby to get him  or her to feed. Breastfeeding times will vary from baby to baby. However, the following rules can serve as a guide to help you ensure that your baby is properly fed:  Newborns (babies 47 weeks of age or younger) may breastfeed every 1-3 hours.  Newborns should not go longer than 3 hours during the day or 5 hours during the night without breastfeeding.  You should breastfeed your baby a minimum of 8 times in a 24-hour period until you begin to introduce solid foods to your baby at around 43 months of age.  Breast milk pumping Pumping and storing breast milk allows you to ensure that your baby is exclusively fed your breast milk, even at times when you are unable to breastfeed. This is especially important if you are going back to work while you are still breastfeeding or when you are not able to be present during feedings. Your lactation consultant can give you guidelines on how long it is safe to store breast milk. A breast pump is a machine that allows you to pump milk from your breast into a sterile bottle. The pumped breast milk can then be stored in a refrigerator or freezer. Some breast pumps are operated by hand, while others use electricity. Ask your lactation consultant which type will work best for you. Breast pumps can be purchased, but some hospitals and breastfeeding support groups lease breast pumps on a monthly basis. A lactation consultant can teach you how to hand express breast milk, if you prefer not to use a pump. Caring for your breasts while you breastfeed Nipples can become dry, cracked, and sore while breastfeeding. The following recommendations can help  keep your breasts moisturized and healthy:  Avoid using soap on your nipples.  Wear a supportive bra. Although not required, special nursing bras and tank tops are designed to allow access to your breasts for breastfeeding without taking off your entire bra or top. Avoid wearing underwire-style bras or extremely tight  bras.  Air dry your nipples for 3-67mnutes after each feeding.  Use only cotton bra pads to absorb leaked breast milk. Leaking of breast milk between feedings is normal.  Use lanolin on your nipples after breastfeeding. Lanolin helps to maintain your skin's normal moisture barrier. If you use pure lanolin, you do not need to wash it off before feeding your baby again. Pure lanolin is not toxic to your baby. You may also hand express a few drops of breast milk and gently massage that milk into your nipples and allow the milk to air dry.  In the first few weeks after giving birth, some women experience extremely full breasts (engorgement). Engorgement can make your breasts feel heavy, warm, and tender to the touch. Engorgement peaks within 3-5 days after you give birth. The following recommendations can help ease engorgement:  Completely empty your breasts while breastfeeding or pumping. You may want to start by applying warm, moist heat (in the shower or with warm water-soaked hand towels) just before feeding or pumping. This increases circulation and helps the milk flow. If your baby does not completely empty your breasts while breastfeeding, pump any extra milk after he or she is finished.  Wear a snug bra (nursing or regular) or tank top for 1-2 days to signal your body to slightly decrease milk production.  Apply ice packs to your breasts, unless this is too uncomfortable for you.  Make sure that your baby is latched on and positioned properly while breastfeeding.  If engorgement persists after 48 hours of following these recommendations, contact your health care provider or a lScience writer Overall health care recommendations while breastfeeding  Eat healthy foods. Alternate between meals and snacks, eating 3 of each per day. Because what you eat affects your breast milk, some of the foods may make your baby more irritable than usual. Avoid eating these foods if you are sure that  they are negatively affecting your baby.  Drink milk, fruit juice, and water to satisfy your thirst (about 10 glasses a day).  Rest often, relax, and continue to take your prenatal vitamins to prevent fatigue, stress, and anemia.  Continue breast self-awareness checks.  Avoid chewing and smoking tobacco. Chemicals from cigarettes that pass into breast milk and exposure to secondhand smoke may harm your baby.  Avoid alcohol and drug use, including marijuana. Some medicines that may be harmful to your baby can pass through breast milk. It is important to ask your health care provider before taking any medicine, including all over-the-counter and prescription medicine as well as vitamin and herbal supplements. It is possible to become pregnant while breastfeeding. If birth control is desired, ask your health care provider about options that will be safe for your baby. Contact a health care provider if:  You feel like you want to stop breastfeeding or have become frustrated with breastfeeding.  You have painful breasts or nipples.  Your nipples are cracked or bleeding.  Your breasts are red, tender, or warm.  You have a swollen area on either breast.  You have a fever or chills.  You have nausea or vomiting.  You have drainage other than breast milk from your nipples.  Your breasts do not become full before feedings by the fifth day after you give birth.  You feel sad and depressed.  Your baby is too sleepy to eat well.  Your baby is having trouble sleeping.  Your baby is wetting less than 3 diapers in a 24-hour period.  Your baby has less than 3 stools in a 24-hour period.  Your baby's skin or the white part of his or her eyes becomes yellow.  Your baby is not gaining weight by 53 days of age. Get help right away if:  Your baby is overly tired (lethargic) and does not want to wake up and feed.  Your baby develops an unexplained fever. This information is not intended to  replace advice given to you by your health care provider. Make sure you discuss any questions you have with your health care provider. Document Released: 09/04/2005 Document Revised: 02/16/2016 Document Reviewed: 02/26/2013 Elsevier Interactive Patient Education  2017 [place]. Vaginal Delivery Vaginal delivery means that you will give birth by pushing your baby out of your birth canal (vagina). A team of health care providers will help you before, during, and after vaginal delivery. Birth experiences are unique for every woman and every pregnancy, and birth experiences vary depending on where you choose to give birth. What should I do to prepare for my baby's birth? Before your baby is born, it is important to talk with your health care provider about:  Your labor and delivery preferences. These may include: ? Medicines that you may be given. ? How you will manage your pain. This might include non-medical pain relief techniques or injectable pain relief such as epidural analgesia. ? How you and your baby will be monitored during labor and delivery. ? Who may be in the labor and delivery room with you. ? Your feelings about surgical delivery of your baby (cesarean delivery, or C-section) if this becomes necessary. ? Your feelings about receiving donated blood through an IV tube (blood transfusion) if this becomes necessary.  Whether you are able: ? To take pictures or videos of the birth. ? To eat during labor and delivery. ? To move around, walk, or change positions during labor and delivery.  What to expect after your baby is born, such as: ? Whether delayed umbilical cord clamping and cutting is offered. ? Who will care for your baby right after birth. ? Medicines or tests that may be recommended for your baby. ? Whether breastfeeding is supported in your hospital or birth center. ? How long you will be in the hospital or birth center.  How any medical conditions you have may  affect your baby or your labor and delivery experience.  To prepare for your baby's birth, you should also:  Attend all of your health care visits before delivery (prenatal visits) as recommended by your health care provider. This is important.  Prepare your home for your baby's arrival. Make sure that you have: ? Diapers. ? Baby clothing. ? Feeding equipment. ? Safe sleeping arrangements for you and your baby.  Install a car seat in your vehicle. Have your car seat checked by a certified car seat installer to make sure that it is installed safely.  Think about who will help you with your new baby at home for at least the first several weeks after delivery.  What can I expect when I arrive at the birth center or hospital? Once you are in labor and have been admitted into the hospital or birth  center, your health care provider may:  Review your pregnancy history and any concerns you have.  Insert an IV tube into one of your veins. This is used to give you fluids and medicines.  Check your blood pressure, pulse, temperature, and heart rate (vital signs).  Check whether your bag of water (amniotic sac) has broken (ruptured).  Talk with you about your birth plan and discuss pain control options.  Monitoring Your health care provider may monitor your contractions (uterine monitoring) and your baby's heart rate (fetal monitoring). You may need to be monitored:  Often, but not continuously (intermittently).  All the time or for long periods at a time (continuously). Continuous monitoring may be needed if: ? You are taking certain medicines, such as medicine to relieve pain or make your contractions stronger. ? You have pregnancy or labor complications.  Monitoring may be done by:  Placing a special stethoscope or a handheld monitoring device on your abdomen to check your baby's heartbeat, and feeling your abdomen for contractions. This method of monitoring does not continuously  record your baby's heartbeat or your contractions.  Placing monitors on your abdomen (external monitors) to record your baby's heartbeat and the frequency and length of contractions. You may not have to wear external monitors all the time.  Placing monitors inside of your uterus (internal monitors) to record your baby's heartbeat and the frequency, length, and strength of your contractions. ? Your health care provider may use internal monitors if he or she needs more information about the strength of your contractions or your baby's heart rate. ? Internal monitors are put in place by passing a thin, flexible wire through your vagina and into your uterus. Depending on the type of monitor, it may remain in your uterus or on your baby's head until birth. ? Your health care provider will discuss the benefits and risks of internal monitoring with you and will ask for your permission before inserting the monitors.  Telemetry. This is a type of continuous monitoring that can be done with external or internal monitors. Instead of having to stay in bed, you are able to move around during telemetry. Ask your health care provider if telemetry is an option for you.  Physical exam Your health care provider may perform a physical exam. This may include:  Checking whether your baby is positioned: ? With the head toward your vagina (head-down). This is most common. ? With the head toward the top of your uterus (head-up or breech). If your baby is in a breech position, your health care provider may try to turn your baby to a head-down position so you can deliver vaginally. If it does not seem that your baby can be born vaginally, your provider may recommend surgery to deliver your baby. In rare cases, you may be able to deliver vaginally if your baby is head-up (breech delivery). ? Lying sideways (transverse). Babies that are lying sideways cannot be delivered vaginally.  Checking your cervix to  determine: ? Whether it is thinning out (effacing). ? Whether it is opening up (dilating). ? How low your baby has moved into your birth canal.  What are the three stages of labor and delivery?  Normal labor and delivery is divided into the following three stages: Stage 1  Stage 1 is the longest stage of labor, and it can last for hours or days. Stage 1 includes: ? Early labor. This is when contractions may be irregular, or regular and mild. Generally, early labor contractions  are more than 10 minutes apart. ? Active labor. This is when contractions get longer, more regular, more frequent, and more intense. ? The transition phase. This is when contractions happen very close together, are very intense, and may last longer than during any other part of labor.  Contractions generally feel mild, infrequent, and irregular at first. They get stronger, more frequent (about every 2-3 minutes), and more regular as you progress from early labor through active labor and transition.  Many women progress through stage 1 naturally, but you may need help to continue making progress. If this happens, your health care provider may talk with you about: ? Rupturing your amniotic sac if it has not ruptured yet. ? Giving you medicine to help make your contractions stronger and more frequent.  Stage 1 ends when your cervix is completely dilated to 4 inches (10 cm) and completely effaced. This happens at the end of the transition phase. Stage 2  Once your cervix is completely effaced and dilated to 4 inches (10 cm), you may start to feel an urge to push. It is common for the body to naturally take a rest before feeling the urge to push, especially if you received an epidural or certain other pain medicines. This rest period may last for up to 1-2 hours, depending on your unique labor experience.  During stage 2, contractions are generally less painful, because pushing helps relieve contraction pain. Instead of  contraction pain, you may feel stretching and burning pain, especially when the widest part of your baby's head passes through the vaginal opening (crowning).  Your health care provider will closely monitor your pushing progress and your baby's progress through the vagina during stage 2.  Your health care provider may massage the area of skin between your vaginal opening and anus (perineum) or apply warm compresses to your perineum. This helps it stretch as the baby's head starts to crown, which can help prevent perineal tearing. ? In some cases, an incision may be made in your perineum (episiotomy) to allow the baby to pass through the vaginal opening. An episiotomy helps to make the opening of the vagina larger to allow more room for the baby to fit through.  It is very important to breathe and focus so your health care provider can control the delivery of your baby's head. Your health care provider may have you decrease the intensity of your pushing, to help prevent perineal tearing.  After delivery of your baby's head, the shoulders and the rest of the body generally deliver very quickly and without difficulty.  Once your baby is delivered, the umbilical cord may be cut right away, or this may be delayed for 1-2 minutes, depending on your baby's health. This may vary among health care providers, hospitals, and birth centers.  If you and your baby are healthy enough, your baby may be placed on your chest or abdomen to help maintain the baby's temperature and to help you bond with each other. Some mothers and babies start breastfeeding at this time. Your health care team will dry your baby and help keep your baby warm during this time.  Your baby may need immediate care if he or she: ? Showed signs of distress during labor. ? Has a medical condition. ? Was born too early (prematurely). ? Had a bowel movement before birth (meconium). ? Shows signs of difficulty transitioning from being inside  the uterus to being outside of the uterus. If you are planning to breastfeed, your  health care team will help you begin a feeding. Stage 3  The third stage of labor starts immediately after the birth of your baby and ends after you deliver the placenta. The placenta is an organ that develops during pregnancy to provide oxygen and nutrients to your baby in the womb.  Delivering the placenta may require some pushing, and you may have mild contractions. Breastfeeding can stimulate contractions to help you deliver the placenta.  After the placenta is delivered, your uterus should tighten (contract) and become firm. This helps to stop bleeding in your uterus. To help your uterus contract and to control bleeding, your health care provider may: ? Give you medicine by injection, through an IV tube, by mouth, or through your rectum (rectally). ? Massage your abdomen or perform a vaginal exam to remove any blood clots that are left in your uterus. ? Empty your bladder by placing a thin, flexible tube (catheter) into your bladder. ? Encourage you to breastfeed your baby. After labor is over, you and your baby will be monitored closely to ensure that you are both healthy until you are ready to go home. Your health care team will teach you how to care for yourself and your baby. This information is not intended to replace advice given to you by your health care provider. Make sure you discuss any questions you have with your health care provider. Document Released: 06/13/2008 Document Revised: 03/24/2016 Document Reviewed: 09/19/2015 Elsevier Interactive Patient Education  2018 [employer]. Pain Relief During Labor and Delivery Many things can cause pain during labor and delivery, including:  Pressure on bones and ligaments due to the baby moving through the pelvis.  Stretching of tissues due to the baby moving through the birth canal.  Muscle tension due to anxiety or nervousness.  The uterus  tightening (contracting) and relaxing to help move the baby.  There are many ways to deal with the pain of labor and delivery. They include:  Taking prenatal classes. Taking these classes helps you know what to expect during your baby's birth. What you learn will increase your confidence and decrease your anxiety.  Practicing relaxation techniques or doing relaxing activities, such as: ? Focused breathing. ? Meditation. ? Visualization. ? Aroma therapy. ? Listening to your favorite music. ? Hypnosis.  Taking a warm shower or bath (hydrotherapy). This may: ? Provide comfort and relaxation. ? Lessen your perception of pain. ? Decrease the amount of pain medicine needed. ? Decrease the length of labor.  Getting a massage or counterpressure on your back.  Applying warm packs or ice packs.  Changing positions often, moving around, or using a birthing ball.  Getting: ? Pain medicine through an IV or injection into a muscle. ? Pain medicine inserted into your spinal column. ? Injections of sterile water just under the skin on your lower back (intradermal injections). ? Laughing gas (nitrous oxide).  Discuss your pain control options with your health care provider during your prenatal visits. Explore the options offered by your hospital or birth center. What kinds of medicine are available? There are two kinds of medicines that can be used to relieve pain during labor and delivery:  Analgesics. These medicines decrease pain without causing you to lose feeling or the ability to move your muscles.  Anesthetics. These medicines block feeling in the body and can decrease your ability to move freely.  Both of these kinds of medicine can cause minor side effects, such as nausea, trouble concentrating, and sleepiness. They  can also decrease the baby's heart rate before birth and affect the baby's breathing rate after birth. For this reason, health care providers are careful about when and  how much medicine is given. What are specific medicines and procedures that provide pain relief? Local Anesthetics Local anesthetics are used to numb a small area of the body. They may be used along with another kind of anesthetic or used to numb the nerves of the vagina, cervix, and perineum during the second stage of labor. General Anesthetics General anesthetics cause you to lose consciousness so you do not feel pain. They are usually only used for an emergency cesarean delivery. General anesthetics are given through an IV tube and a mask. Pudendal Block A pudendal block is a form of local anesthetic. It may be used to relieve the pain associated with pushing or stretching of the perineum at the time of delivery or to further numb the perineum. A pudendal block is done by injecting numbing medicine through the vaginal wall into a nerve in the pelvis. Epidural Analgesia Epidural analgesia is given through a flexible IV catheter that is inserted into the lower back. Numbing medicine is delivered continuously to the area near your spinal column nerves (epidural space). After having this type of analgesia, you may be able to move your legs but you most likely will not be able to walk. Depending on the amount of medicine given, you may lose all feeling in the lower half of your body, or you may retain some level of sensation, including the urge to push. Epidural analgesia can be used to provide pain relief for a vaginal birth. Spinal Block A spinal block is similar to epidural analgesia, but the medicine is injected into the spinal fluid instead of the epidural space. A spinal block is only given once. It starts to relieve pain quickly, but the pain relief lasts only 1-6 hours. Spinal blocks can be used for cesarean deliveries. Combined Spinal-Epidural (CSE) Block A CSE block combines the effects of a spinal block and epidural analgesia. The spinal block works quickly to block all pain. The epidural  analgesia provides continuous pain relief, even after the effects of the spinal block have worn off. This information is not intended to replace advice given to you by your health care provider. Make sure you discuss any questions you have with your health care provider. Document Released: 12/21/2008 Document Revised: 02/11/2016 Document Reviewed: 01/26/2016 Elsevier Interactive Patient Education  2018 [place] of Pregnancy The third trimester is from week 29 through week 42, months 7 through 9. This trimester is when your unborn baby (fetus) is growing very fast. At the end of the ninth month, the unborn baby is about 20 inches in length. It weighs about 6-10 pounds. Follow these instructions at home:  Avoid all smoking, herbs, and alcohol. Avoid drugs not approved by your doctor.  Do not use any tobacco products, including cigarettes, chewing tobacco, and electronic cigarettes. If you need help quitting, ask your doctor. You may get counseling or other support to help you quit.  Only take medicine as told by your doctor. Some medicines are safe and some are not during pregnancy.  Exercise only as told by your doctor. Stop exercising if you start having cramps.  Eat regular, healthy meals.  Wear a good support bra if your breasts are tender.  Do not use hot tubs, steam rooms, or saunas.  Wear your seat belt when driving.  Avoid raw meat, uncooked  cheese, and liter boxes and soil used by cats.  Take your prenatal vitamins.  Take 1500-2000 milligrams of calcium daily starting at the 20th week of pregnancy until you deliver your baby.  Try taking medicine that helps you poop (stool softener) as needed, and if your doctor approves. Eat more fiber by eating fresh fruit, vegetables, and whole grains. Drink enough fluids to keep your pee (urine) clear or pale yellow.  Take warm water baths (sitz baths) to soothe pain or discomfort caused by hemorrhoids. Use hemorrhoid  cream if your doctor approves.  If you have puffy, bulging veins (varicose veins), wear support hose. Raise (elevate) your feet for 15 minutes, 3-4 times a day. Limit salt in your diet.  Avoid heavy lifting, wear low heels, and sit up straight.  Rest with your legs raised if you have leg cramps or low back pain.  Visit your dentist if you have not gone during your pregnancy. Use a soft toothbrush to brush your teeth. Be gentle when you floss.  You can have sex (intercourse) unless your doctor tells you not to.  Do not travel far distances unless you must. Only do so with your doctor's approval.  Take prenatal classes.  Practice driving to the hospital.  Pack your hospital bag.  Prepare the baby's room.  Go to your doctor visits. Get help if:  You are not sure if you are in labor or if your water has broken.  You are dizzy.  You have mild cramps or pressure in your lower belly (abdominal).  You have a nagging pain in your belly area.  You continue to feel sick to your stomach (nauseous), throw up (vomit), or have watery poop (diarrhea).  You have bad smelling fluid coming from your vagina.  You have pain with peeing (urination). Get help right away if:  You have a fever.  You are leaking fluid from your vagina.  You are spotting or bleeding from your vagina.  You have severe belly cramping or pain.  You lose or gain weight rapidly.  You have trouble catching your breath and have chest pain.  You notice sudden or extreme puffiness (swelling) of your face, hands, ankles, feet, or legs.  You have not felt the baby move in over an hour.  You have severe headaches that do not go away with medicine.  You have vision changes. This information is not intended to replace advice given to you by your health care provider. Make sure you discuss any questions you have with your health care provider. Document Released: 11/29/2009 Document Revised: 02/10/2016 Document  Reviewed: 11/05/2012 Elsevier Interactive Patient Education  2017 [employer].

## 2017-08-03 NOTE — Progress Notes (Addendum)
ROB-Pt doing well, glucola and CBC today. Pt has relocated to [place], plans transfer to [employer]. Awaiting records. Anticipatory guidance regarding course of prenatal care. Education regarding intrapartum pain management and postpartum contraception options, handouts and booklet given. Blood transfusion consent reviewed and signed. Encouraged childbirth classes, option given for [facility]. Reviewed red flag symptoms and when to call. RTC x 2 weeks for ROB with Pattricia BossAnnie or sooner if needed.

## 2017-08-03 NOTE — Progress Notes (Signed)
ROB- glucola done, blood consent signed, pt is doing well 

## 2017-08-04 LAB — CBC
HEMATOCRIT: 33.8 % — AB (ref 34.0–46.6)
Hemoglobin: 11.6 g/dL (ref 11.1–15.9)
MCH: 30.6 pg (ref 26.6–33.0)
MCHC: 34.3 g/dL (ref 31.5–35.7)
MCV: 89 fL (ref 79–97)
Platelets: 253 10*3/uL (ref 150–379)
RBC: 3.79 x10E6/uL (ref 3.77–5.28)
RDW: 13.2 % (ref 12.3–15.4)
WBC: 7 10*3/uL (ref 3.4–10.8)

## 2017-08-04 LAB — GLUCOSE, 1 HOUR GESTATIONAL: Gestational Diabetes Screen: 96 mg/dL (ref 65–139)

## 2017-08-17 ENCOUNTER — Encounter: Payer: Self-pay | Admitting: Certified Nurse Midwife

## 2017-08-17 ENCOUNTER — Ambulatory Visit (INDEPENDENT_AMBULATORY_CARE_PROVIDER_SITE_OTHER): Payer: BLUE CROSS/BLUE SHIELD | Admitting: Certified Nurse Midwife

## 2017-08-17 VITALS — BP 102/75 | HR 97 | Wt 130.2 lb

## 2017-08-17 DIAGNOSIS — Z3403 Encounter for supervision of normal first pregnancy, third trimester: Secondary | ICD-10-CM

## 2017-08-17 LAB — POCT URINALYSIS DIPSTICK
BILIRUBIN UA: NEGATIVE
Glucose, UA: NEGATIVE
KETONES UA: NEGATIVE
LEUKOCYTES UA: NEGATIVE
Nitrite, UA: NEGATIVE
PH UA: 7 (ref 5.0–8.0)
PROTEIN UA: NEGATIVE
RBC UA: NEGATIVE
Spec Grav, UA: 1.01 (ref 1.010–1.025)
Urobilinogen, UA: 0.2 E.U./dL

## 2017-08-17 NOTE — Patient Instructions (Signed)

## 2017-08-17 NOTE — Progress Notes (Signed)
ROB- Pt states she is doing well, pt would like to discuss tdap further, pt states she does have medicaid now

## 2017-08-17 NOTE — Progress Notes (Signed)
ROB, doing well no complaints . Today is her last visit with us, she is transferring care due to relocation to [place]. Discussed TDAP , she states that she will have it done at the new office at her next visit. She has it scheduled for 2 wks.   Doreene BurkeAnnie Amrie Gurganus, CNM

## 2018-01-21 ENCOUNTER — Encounter (HOSPITAL_COMMUNITY): Payer: Self-pay

## 2018-02-19 ENCOUNTER — Encounter: Payer: BLUE CROSS/BLUE SHIELD | Admitting: Certified Nurse Midwife

## 2018-06-05 ENCOUNTER — Encounter: Payer: Self-pay | Admitting: Emergency Medicine

## 2018-06-05 ENCOUNTER — Ambulatory Visit
Admission: EM | Admit: 2018-06-05 | Discharge: 2018-06-05 | Disposition: A | Discharge status: Home / Self Care | Payer: BLUE CROSS/BLUE SHIELD | Attending: Family Medicine | Admitting: Family Medicine

## 2018-06-05 ENCOUNTER — Other Ambulatory Visit: Payer: Self-pay

## 2018-06-05 DIAGNOSIS — L309 Dermatitis, unspecified: Secondary | ICD-10-CM

## 2018-06-05 DIAGNOSIS — L2481 Irritant contact dermatitis due to metals: Secondary | ICD-10-CM

## 2018-06-05 MED ORDER — TRIAMCINOLONE ACETONIDE 0.1 % EX CREA: 1.0000 "application " | TOPICAL_CREAM | Freq: Two times a day (BID) | CUTANEOUS | 0 refills | Status: AC

## 2018-06-05 NOTE — ED Provider Notes (Signed)
[facility]    CSN: [id number] Arrival date & time: 06/05/18  1243     History   Chief Complaint Chief Complaint  Patient presents with  . Rash    HPI Rose Oneal is a 27 y.o. female.   27 yo female with a c/o itchy, red rash to left ring finger where she's been wearing rings. Has had rash for 2-3 days. Denies any pain, fevers, blisters.   The history is provided by the patient.  Rash    History reviewed. No pertinent past medical history.  Patient Active Problem List   Diagnosis Date Noted  . Encounter for supervision of normal first pregnancy in first trimester 04/09/2017    Past Surgical History:  Procedure Laterality Date  . NO PAST SURGERIES      OB History    Gravida  1   Para      Term      Preterm      AB      Living        SAB      TAB      Ectopic      Multiple      Live Births               Home Medications    Prior to Admission medications   Medication Sig Start Date End Date Taking? Authorizing Provider  norethindrone-ethinyl estradiol (MICROGESTIN,JUNEL,LOESTRIN) 1-20 MG-MCG tablet Take 1 tablet by mouth daily.   Yes [provider]  ondansetron (ZOFRAN ODT) 4 MG disintegrating tablet Take 1 tablet (4 mg total) by mouth every 6 (six) hours as needed for nausea. Patient not taking: Reported on 06/08/2017 05/11/17   Doreene Burke, CNM  Prenatal Vit-Fe Fumarate-FA (PRENATAL VITAMINS PLUS PO) Take by mouth.    [provider]  promethazine (PHENERGAN) 25 MG suppository Place 1 suppository (25 mg total) rectally every 6 (six) hours as needed for nausea or vomiting. 05/13/17   Lawhorn, Vanessa Litchfield, CNM  triamcinolone cream (KENALOG) 0.1 % Apply 1 application topically 2 (two) times daily. 06/05/18   Payton Mccallum, MD    Family History Family History  Problem Relation Age of Onset  . Healthy Mother   . Healthy Father   . Cancer Paternal Grandmother        Lymphoma  . Stroke Paternal  Grandmother        great  . Cancer Paternal Aunt        (great) breast cancer    Social History Social History   Tobacco Use  . Smoking status: Never Smoker  . Smokeless tobacco: Never Used  Substance Use Topics  . Alcohol use: No  . Drug use: No     Allergies   Patient has no known allergies.   Review of Systems Review of Systems  Skin: Positive for rash.     Physical Exam Triage Vital Signs ED Triage Vitals  Enc Vitals Group     BP 06/05/18 1300 98/77     Pulse Rate 06/05/18 1300 79     Resp 06/05/18 1300 18     Temp 06/05/18 1300 98.4 F (36.9 C)     Temp Source 06/05/18 1300 Oral     SpO2 06/05/18 1300 100 %     Weight 06/05/18 1301 135 lb (61.2 kg)     Height 06/05/18 1301 5\' 2"  (1.575 m)     Head Circumference --      Peak Flow --  Pain Score 06/05/18 1301 0     Pain Loc --      Pain Edu? --      Excl. in GC? --    No data found.  Updated Vital Signs BP 98/77 (BP Location: Left Arm)   Pulse 79   Temp 98.4 F (36.9 C) (Oral)   Resp 18   Ht 5\' 2"  (1.575 m)   Wt 61.2 kg   LMP 05/15/2018 (Approximate)   SpO2 100%   Breastfeeding? No   BMI 24.69 kg/m   Visual Acuity Right Eye Distance:   Left Eye Distance:   Bilateral Distance:    Right Eye Near:   Left Eye Near:    Bilateral Near:     Physical Exam  Constitutional: She appears well-developed and well-nourished. No distress.  Skin: Rash noted. She is not diaphoretic. There is erythema.  Scaly, erythematous circumscribed rash to 4th (ring) finger on left hand   Nursing note and vitals reviewed.    UC Treatments / Results  Labs (all labs ordered are listed, but only abnormal results are displayed) Labs Reviewed - No data to display  EKG None  Radiology No results found.  Procedures Procedures (including critical care time)  Medications Ordered in UC Medications - No data to display  Initial Impression / Assessment and Plan / UC Course  I have reviewed the triage  vital signs and the nursing notes.  Pertinent labs & imaging results that were available during my care of the patient were reviewed by me and considered in my medical decision making (see chart for details).      Final Clinical Impressions(s) / UC Diagnoses   Final diagnoses:  Eczema, unspecified type  Irritant contact dermatitis due to metals   Discharge Instructions   None    ED Prescriptions    Medication Sig Dispense Auth. Provider   triamcinolone cream (KENALOG) 0.1 % Apply 1 application topically 2 (two) times daily. 30 g Payton Mccallumonty, Aisha Greenberger, MD     1. diagnosis reviewed with patient 2. rx as per orders above; reviewed possible side effects, interactions, risks and benefits  3. Follow-up prn if symptoms worsen or don't improve  Controlled Substance Prescriptions [place] Controlled Substance Registry consulted? Not Applicable   Payton Mccallumonty, Jilliana Burkes, MD 06/05/18 [phone]

## 2018-06-05 NOTE — ED Triage Notes (Signed)
Patient c/o rash to her left ring finger that started 2-3 days ago. Patient does report washing her hands frequently.

## 2018-10-21 ENCOUNTER — Emergency Department
Admission: EM | Admit: 2018-10-21 | Discharge: 2018-10-21 | Disposition: A | Discharge status: Home / Self Care | Attending: Emergency Medicine | Admitting: Emergency Medicine

## 2018-10-21 ENCOUNTER — Other Ambulatory Visit: Payer: Self-pay

## 2018-10-21 ENCOUNTER — Emergency Department

## 2018-10-21 DIAGNOSIS — R1030 Lower abdominal pain, unspecified: Secondary | ICD-10-CM | POA: Diagnosis present

## 2018-10-21 DIAGNOSIS — N83201 Unspecified ovarian cyst, right side: Secondary | ICD-10-CM | POA: Diagnosis not present

## 2018-10-21 DIAGNOSIS — R1032 Left lower quadrant pain: Secondary | ICD-10-CM | POA: Diagnosis not present

## 2018-10-21 DIAGNOSIS — Z79899 Other long term (current) drug therapy: Secondary | ICD-10-CM | POA: Diagnosis not present

## 2018-10-21 DIAGNOSIS — R102 Pelvic and perineal pain: Secondary | ICD-10-CM | POA: Diagnosis not present

## 2018-10-21 DIAGNOSIS — R1031 Right lower quadrant pain: Secondary | ICD-10-CM | POA: Diagnosis not present

## 2018-10-21 LAB — COMPREHENSIVE METABOLIC PANEL
ALT: 17 U/L (ref 0–44)
AST: 18 U/L (ref 15–41)
Albumin: 3.9 g/dL (ref 3.5–5.0)
Alkaline Phosphatase: 45 U/L (ref 38–126)
Anion gap: 7 (ref 5–15)
BUN: 9 mg/dL (ref 6–20)
CHLORIDE: 107 mmol/L (ref 98–111)
CO2: 22 mmol/L (ref 22–32)
Calcium: 8.5 mg/dL — ABNORMAL LOW (ref 8.9–10.3)
Creatinine, Ser: 0.6 mg/dL (ref 0.44–1.00)
GFR calc Af Amer: >60 mL/min (ref >60)
GFR calc non Af Amer: >60 mL/min (ref >60)
Glucose, Bld: 84 mg/dL (ref 70–99)
Potassium: 3.5 mmol/L (ref 3.5–5.1)
Sodium: 136 mmol/L (ref 135–145)
Total Bilirubin: 0.5 mg/dL (ref 0.3–1.2)
Total Protein: 7.2 g/dL (ref 6.5–8.1)

## 2018-10-21 LAB — CBC
HCT: 39.1 % (ref 36.0–46.0)
Hemoglobin: 12.8 g/dL (ref 12.0–15.0)
MCH: 29.2 pg (ref 26.0–34.0)
MCHC: 32.7 g/dL (ref 30.0–36.0)
MCV: 89.1 fL (ref 80.0–100.0)
Platelets: 313 10*3/uL (ref 150–400)
RBC: 4.39 MIL/uL (ref 3.87–5.11)
RDW: 11.9 % (ref 11.5–15.5)
WBC: 16.3 10*3/uL — ABNORMAL HIGH (ref 4.0–10.5)
nRBC: 0 % (ref 0.0–0.2)

## 2018-10-21 LAB — URINALYSIS, COMPLETE (UACMP) WITH MICROSCOPIC
Bacteria, UA: NONE SEEN
Bilirubin Urine: NEGATIVE
Glucose, UA: NEGATIVE mg/dL
KETONES UR: NEGATIVE mg/dL
Leukocytes, UA: NEGATIVE
NITRITE: NEGATIVE
Protein, ur: NEGATIVE mg/dL
Specific Gravity, Urine: 1.018 (ref 1.005–1.030)
pH: 6 (ref 5.0–8.0)

## 2018-10-21 LAB — POCT PREGNANCY, URINE: Preg Test, Ur: NEGATIVE

## 2018-10-21 LAB — LIPASE, BLOOD: LIPASE: 27 U/L (ref 11–51)

## 2018-10-21 MED ORDER — NAPROXEN 500 MG PO TABS: 500.0000 mg | ORAL_TABLET | Freq: Two times a day (BID) | ORAL | 2 refills | Status: AC

## 2018-10-21 NOTE — ED Triage Notes (Signed)
First Nurse Note:  C/O abdominal pain today.   Patient is AAOx3.  Skin warm and dry.  Gait steady.  Posture upright and relaxed.  NAD

## 2018-10-21 NOTE — ED Triage Notes (Signed)
Pt c/o lower abd pain x 1 hour. States has happened before but now pain is coming and going. Denies N&V&D. Denies vaginal bleeding/discharge. Denies urinary symptoms.   A&O, ambulatory.

## 2018-10-21 NOTE — ED Provider Notes (Signed)
[facility] Emergency Department Provider Note   ____________________________________________    I have reviewed the triage vital signs and the nursing notes.   HISTORY  Chief Complaint Abdominal Pain     HPI Rose Oneal is a 28 y.o. female who presents with complaints of lower abdominal pain.  Patient describes waking up this morning with moderate pain in her right lower quadrant and some pain in her left lower quadrant.  Mild nausea no vomiting.  Normal stools.  No fevers or chills.  Has not take anything for this.  No sick contacts.  No recent travel.  No history of abdominal surgeries.  No vaginal bleeding  History reviewed. No pertinent past medical history.  Patient Active Problem List   Diagnosis Date Noted  . Encounter for supervision of normal first pregnancy in first trimester 04/09/2017    Past Surgical History:  Procedure Laterality Date  . NO PAST SURGERIES      Prior to Admission medications   Medication Sig Start Date End Date Taking? Authorizing Provider  naproxen (NAPROSYN) 500 MG tablet Take 1 tablet (500 mg total) by mouth 2 (two) times daily with a meal. 10/21/18   Jene Every, MD  norethindrone-ethinyl estradiol (MICROGESTIN,JUNEL,LOESTRIN) 1-20 MG-MCG tablet Take 1 tablet by mouth daily.    [provider]  ondansetron (ZOFRAN ODT) 4 MG disintegrating tablet Take 1 tablet (4 mg total) by mouth every 6 (six) hours as needed for nausea. Patient not taking: Reported on 06/08/2017 05/11/17   Doreene Burke, CNM  Prenatal Vit-Fe Fumarate-FA (PRENATAL VITAMINS PLUS PO) Take by mouth.    [provider]  promethazine (PHENERGAN) 25 MG suppository Place 1 suppository (25 mg total) rectally every 6 (six) hours as needed for nausea or vomiting. 05/13/17   Lawhorn, Vanessa [place], CNM  triamcinolone cream (KENALOG) 0.1 % Apply 1 application topically 2 (two) times daily. 06/05/18   Payton Mccallum, MD      Allergies Patient has no known allergies.  Family History  Problem Relation Age of Onset  . Healthy Mother   . Healthy Father   . Cancer Paternal Grandmother        Lymphoma  . Stroke Paternal Grandmother        great  . Cancer Paternal Aunt        (great) breast cancer    Social History Social History   Tobacco Use  . Smoking status: Never Smoker  . Smokeless tobacco: Never Used  Substance Use Topics  . Alcohol use: No  . Drug use: No    Review of Systems  Constitutional: No fever/chills Eyes: No visual changes.  ENT: No sore throat. Cardiovascular: Denies chest pain. Respiratory: Denies shortness of breath. Gastrointestinal: As above Genitourinary: Negative for dysuria. Musculoskeletal: Negative for back pain. Skin: Negative for rash. Neurological: Negative for headaches    ____________________________________________   PHYSICAL EXAM:  VITAL SIGNS: ED Triage Vitals [10/21/18 1327]  Enc Vitals Group     BP (!) 100/56     Pulse Rate 72     Resp 16     Temp 98 F (36.7 C)     Temp Source Oral     SpO2 100 %     Weight 57.2 kg (126 lb)     Height 1.575 m (5\' 2" )     Head Circumference      Peak Flow      Pain Score 2     Pain Loc  Pain Edu?      Excl. in GC?     Constitutional: Alert and oriented.  Eyes: Conjunctivae are normal.   Nose: No congestion/rhinnorhea. Mouth/Throat: Mucous membranes are moist.   Cardiovascular: Normal rate, regular rhythm. Grossly normal heart sounds.  Good peripheral circulation. Respiratory: Normal respiratory effort.  No retractions. Lungs CTAB. Gastrointestinal: Mild tenderness in the right lower quadrant, minimal tenderness in left lower quadrant no distention.  No CVA tenderness.  Musculoskeletal:  Warm and well perfused Neurologic:  Normal speech and language. No gross focal neurologic deficits are appreciated.  Skin:  Skin is warm, dry and intact. No rash noted. Psychiatric: Mood and affect are  normal. Speech and behavior are normal.  ____________________________________________   LABS (all labs ordered are listed, but only abnormal results are displayed)  Labs Reviewed  COMPREHENSIVE METABOLIC PANEL - Abnormal; Notable for the following components:      Result Value   Calcium 8.5 (*)    All other components within normal limits  CBC - Abnormal; Notable for the following components:   WBC 16.3 (*)    All other components within normal limits  URINALYSIS, COMPLETE (UACMP) WITH MICROSCOPIC - Abnormal; Notable for the following components:   Color, Urine YELLOW (*)    APPearance CLEAR (*)    Hgb urine dipstick SMALL (*)    All other components within normal limits  LIPASE, BLOOD  POC URINE PREG, ED  POCT PREGNANCY, URINE   ____________________________________________  EKG  None ____________________________________________  RADIOLOGY  CT negative for appendicitis, moderate free fluid in the pelvis ____________________________________________   PROCEDURES  Procedure(s) performed: No  Procedures   Critical Care performed: No ____________________________________________   INITIAL IMPRESSION / ASSESSMENT AND PLAN / ED COURSE  Pertinent labs & imaging results that were available during my care of the patient were reviewed by me and considered in my medical decision making (see chart for details).  Patient presents with lower abdominal pain as described above, differential includes appendicitis, ovarian cyst, urinary tract infection.  CT negative for appendicitis or ureterolithiasis however moderate free fluid noted.  Will obtain ultrasound of the pelvis  Ultrasound demonstrates 4.5 cm cyst, likely hemorrhagic, ruptured this is likely the cause of her pain.  Recommended follow-up ultrasound in 8 to 12 weeks to demonstrate resolution.  Anti-inflammatories for pain, outpatient follow-up as needed.  Return precautions discussed     ____________________________________________   FINAL CLINICAL IMPRESSION(S) / ED DIAGNOSES  Final diagnoses:  Pelvic pain  Cyst of right ovary        Note:  This document was prepared using Dragon voice recognition software and may include unintentional dictation errors.   Jene EveryKinner, Yessika Otte, MD 10/21/18 [phone]

## 2018-11-07 ENCOUNTER — Ambulatory Visit
Admission: EM | Admit: 2018-11-07 | Discharge: 2018-11-07 | Disposition: A | Discharge status: Home / Self Care | Attending: Emergency Medicine | Admitting: Emergency Medicine

## 2018-11-07 ENCOUNTER — Encounter: Payer: Self-pay | Admitting: Emergency Medicine

## 2018-11-07 ENCOUNTER — Other Ambulatory Visit: Payer: Self-pay

## 2018-11-07 DIAGNOSIS — R0981 Nasal congestion: Secondary | ICD-10-CM | POA: Diagnosis not present

## 2018-11-07 DIAGNOSIS — R05 Cough: Secondary | ICD-10-CM

## 2018-11-07 DIAGNOSIS — J069 Acute upper respiratory infection, unspecified: Secondary | ICD-10-CM

## 2018-11-07 DIAGNOSIS — R112 Nausea with vomiting, unspecified: Secondary | ICD-10-CM

## 2018-11-07 DIAGNOSIS — J029 Acute pharyngitis, unspecified: Secondary | ICD-10-CM | POA: Diagnosis not present

## 2018-11-07 LAB — RAPID INFLUENZA A&B ANTIGENS
Influenza A (ARMC): NEGATIVE
Influenza B (ARMC): NEGATIVE

## 2018-11-07 LAB — RAPID STREP SCREEN (MED CTR MEBANE ONLY): Streptococcus, Group A Screen (Direct): NEGATIVE

## 2018-11-07 MED ORDER — ONDANSETRON 8 MG PO TBDP
8.0000 mg | ORAL_TABLET | Freq: Once | ORAL | Status: AC
  Administered 2018-11-07: 8 mg via ORAL

## 2018-11-07 NOTE — Discharge Instructions (Addendum)
Rest. Drink plenty of fluids.  Bland diet. Zofran as needed.   Follow up with your primary care physician this week as needed. Return to Urgent care for new or worsening concerns.

## 2018-11-07 NOTE — ED Triage Notes (Addendum)
Patient in today c/o emesis, chills, headache and body aches since yesterday. Patient has not taken any OTC medications.

## 2018-11-07 NOTE — ED Provider Notes (Signed)
[facility] ____________________________________________  Time seen: Approximately 2:17 PM  I have reviewed the triage vital signs and the nursing notes.   HISTORY  Chief Complaint Emesis (APPT) and Generalized Body Aches  HPI Rose Oneal is a 28 y.o. female presenting for evaluation of nausea vomiting and chills that started last night.  Patient states that around 7 PM after she got home she started feeling nauseous and vomited 3 times.  States normal colored vomit, no blood.  States this stopped around 11 and she was unable to sleep through the night.  States does feel nauseous still today and has decreased appetite.  No diarrhea.  Had normal bowel movement yesterday, with normal coloration.  Denies current abdominal pain.  States had some cramping in abdomen last night, but no abdominal discomfort currently.  Did recently had a ovarian cyst, but states that pain has fully resolved.  Also states for the last week she has had some nasal congestion, sore throat and cough, but states she felt like this was a typical cold or weather change because.  States mild sore throat currently.  Denies any sinus pain, chest congestion, chest pain, shortness of breath.  States that she felt like she was getting over her cold.  Reports that her mother was seen for vomiting and diarrhea earlier this week, as well as her daughter had the same in the last few days and now her.  Did try one Zofran last night, but states she vomited right after swallowing it.  Denies other alleviating measures.  Has tolerated fluids today. Reports otherwise doing well denies recent sickness.  Denies known fevers.  Patient's last menstrual period was 10/24/2018 (approximate).denies pregnancy.  History reviewed. No pertinent past medical history.  Patient Active Problem List   Diagnosis Date Noted  . Encounter for supervision of normal first pregnancy in first trimester 04/09/2017    Past Surgical History:    Procedure Laterality Date  . NO PAST SURGERIES       No current facility-administered medications for this encounter.   Current Outpatient Medications:  Marland Kitchen  Multiple Vitamin (MULTIVITAMIN) tablet, Take 1 tablet by mouth daily., Disp: , Rfl:  .  naproxen (NAPROSYN) 500 MG tablet, Take 1 tablet (500 mg total) by mouth 2 (two) times daily with a meal., Disp: 20 tablet, Rfl: 2 .  norethindrone-ethinyl estradiol (MICROGESTIN,JUNEL,LOESTRIN) 1-20 MG-MCG tablet, Take 1 tablet by mouth daily., Disp: , Rfl:  .  ondansetron (ZOFRAN ODT) 4 MG disintegrating tablet, Take 1 tablet (4 mg total) by mouth every 6 (six) hours as needed for nausea., Disp: 20 tablet, Rfl: 0 .  triamcinolone cream (KENALOG) 0.1 %, Apply 1 application topically 2 (two) times daily., Disp: 30 g, Rfl: 0  Allergies Patient has no known allergies.  Family History  Problem Relation Age of Onset  . Healthy Mother   . Healthy Father   . Cancer Paternal Grandmother        Lymphoma  . Stroke Paternal Grandmother        great  . Cancer Paternal Aunt        (great) breast cancer    Social History Social History   Tobacco Use  . Smoking status: Never Smoker  . Smokeless tobacco: Never Used  Substance Use Topics  . Alcohol use: Never    Frequency: Never  . Drug use: No    Review of Systems Constitutional: No known fever.  Positive chills. ENT: Positive sore throat, mild.  Mild nasal congestion intermittently. Cardiovascular: Denies  chest pain. Respiratory: Denies shortness of breath. Gastrointestinal: As above. Genitourinary: Negative for dysuria. Musculoskeletal: Negative for back pain. Skin: Negative for rash.   ____________________________________________   PHYSICAL EXAM:  VITAL SIGNS: ED Triage Vitals  Enc Vitals Group     BP 11/07/18 1306 101/85     Pulse Rate 11/07/18 1306 (!) 110     Resp 11/07/18 1306 16     Temp 11/07/18 1306 98.9 F (37.2 C)     Temp Source 11/07/18 1306 Oral     SpO2  11/07/18 1306 99 %     Weight 11/07/18 1307 125 lb (56.7 kg)     Height 11/07/18 1307 5\' 2"  (1.575 m)     Head Circumference --      Peak Flow --      Pain Score 11/07/18 1306 5     Pain Loc --      Pain Edu? --      Excl. in GC? --     Constitutional: Alert and oriented. Well appearing and in no acute distress. Eyes: Conjunctivae are normal. Head: Atraumatic. No sinus tenderness to palpation. No swelling. No erythema.  Ears: no erythema, normal TMs bilaterally.   Nose:No nasal congestion   Mouth/Throat: Mucous membranes are moist. Mild pharyngeal erythema. No tonsillar swelling or exudate.  Neck: No stridor.  No cervical spine tenderness to palpation. Hematological/Lymphatic/Immunilogical: No cervical lymphadenopathy. Cardiovascular: Normal rate, regular rhythm. Grossly normal heart sounds.  Good peripheral circulation. Respiratory: Normal respiratory effort.  No retractions. No wheezes, rales or rhonchi. Good air movement.  Gastrointestinal: Normal Bowel sounds.  Abdomen soft and nontender.  Non-guarding.  No CVA tenderness. Musculoskeletal: Ambulatory with steady gait.  Neurologic:  Normal speech and language. No gait instability. Skin:  Skin appears warm, dry and intact. No rash noted. Psychiatric: Mood and affect are normal. Speech and behavior are normal.  ___________________________________________   LABS (all labs ordered are listed, but only abnormal results are displayed)  Labs Reviewed  RAPID INFLUENZA A&B ANTIGENS ([facility] ONLY)  RAPID STREP SCREEN ([facility] ONLY)  CULTURE, GROUP A STREP [facility])     PROCEDURES   INITIAL IMPRESSION / ASSESSMENT AND PLAN / ED COURSE  Pertinent labs & imaging results that were available during my care of the patient were reviewed by me and considered in my medical decision making (see chart for details).  Well-appearing patient.  No acute distress.  Influenza negative.  Strep negative.  Suspect viral gastroenteritis.  8 mg  ODT Zofran given once in urgent care.  States that she has some Zofran at home and she will take as needed.  Encourage brat diet, rest, fluids and supportive care.  Suspect recent viral upper respiratory infection versus allergic rhinitis, and patient states that she feels like she is improving from this.  Supportive care.  Work note given.Discussed indication, risks and benefits of medications with patient.  Discussed follow up with Primary care physician this week. Discussed follow up and return parameters including no resolution or any worsening concerns. Patient verbalized understanding and agreed to plan.   ____________________________________________   FINAL CLINICAL IMPRESSION(S) / ED DIAGNOSES  Final diagnoses:  Non-intractable vomiting with nausea, unspecified vomiting type  Upper respiratory tract infection, unspecified type     ED Discharge Orders    None       Note: This dictation was prepared with Dragon dictation along with smaller phrase technology. Any transcriptional errors that result from this process are unintentional.  Renford DillsMiller, Eshawn Coor, NP 11/07/18 1526

## 2018-11-10 LAB — CULTURE, GROUP A STREP (THRC)

## 2019-01-20 ENCOUNTER — Other Ambulatory Visit: Payer: Self-pay

## 2019-01-20 ENCOUNTER — Telehealth: Payer: Self-pay | Admitting: Certified Nurse Midwife

## 2019-01-20 MED ORDER — NORETHINDRONE ACET-ETHINYL EST 1-20 MG-MCG PO TABS: 1.0000 | ORAL_TABLET | Freq: Every day | ORAL | 0 refills | Status: AC

## 2019-01-20 NOTE — Telephone Encounter (Signed)
The patient sent a MyChart request to schedule her annual exam, pt was informed due to COVID-19 the appointment will be scheduled out 8 weeks. And I informed the patient a message will be sent back to her nurse in regards to her needing a birth control refill. Please advise.

## 2019-03-14 ENCOUNTER — Telehealth: Payer: Self-pay | Admitting: *Deleted

## 2019-03-14 NOTE — Telephone Encounter (Signed)
Coronavirus (COVID-19) Are you at risk?  Are you at risk for the Coronavirus (COVID-19)?  To be considered HIGH RISK for Coronavirus (COVID-19), you have to meet the following criteria:  . Traveled to [place], [place], [place], [place] or [place]; or in the [place] to [place], [place], [place], or [place]; and have fever, cough, and shortness of breath within the last 2 weeks of travel OR . Been in close contact with a person diagnosed with COVID-19 within the last 2 weeks and have fever, cough, and shortness of breath . IF YOU DO NOT MEET THESE CRITERIA, YOU ARE CONSIDERED LOW RISK FOR COVID-19.  What to do if you are HIGH RISK for COVID-19?  . If you are having a medical emergency, call 911. . Seek medical care right away. Before you go to a doctor's office, urgent care or emergency department, call ahead and tell them about your recent travel, contact with someone diagnosed with COVID-19, and your symptoms. You should receive instructions from your physician's office regarding next steps of care.  . When you arrive at healthcare provider, tell the healthcare staff immediately you have returned from visiting [place], [place], [place], [place] or [place]; or traveled in the [place] to [place], [place], [place], or [place]; in the last two weeks or you have been in close contact with a person diagnosed with COVID-19 in the last 2 weeks.   . Tell the health care staff about your symptoms: fever, cough and shortness of breath. . After you have been seen by a medical provider, you will be either: o Tested for (COVID-19) and discharged home on quarantine except to seek medical care if symptoms worsen, and asked to  - Stay home and avoid contact with others until you get your results (4-5 days)  - Avoid travel on public transportation if possible (such as bus, train, or airplane) or o Sent to the Emergency Department by EMS for evaluation, COVID-19 testing, and possible  admission depending on your condition and test results.  What to do if you are LOW RISK for COVID-19?  Reduce your risk of any infection by using the same precautions used for avoiding the common cold or flu:  . Wash your hands often with soap and warm water for at least 20 seconds.  If soap and water are not readily available, use an alcohol-based hand sanitizer with at least 60% alcohol.  . If coughing or sneezing, cover your mouth and nose by coughing or sneezing into the elbow areas of your shirt or coat, into a tissue or into your sleeve (not your hands). . Avoid shaking hands with others and consider head nods or verbal greetings only. . Avoid touching your eyes, nose, or mouth with unwashed hands.  . Avoid close contact with people who are sick. . Avoid places or events with large numbers of people in one location, like concerts or sporting events. . Carefully consider travel plans you have or are making. . If you are planning any travel outside or inside the [place], visit the CDC's Travelers' Health webpage for the latest health notices. . If you have some symptoms but not all symptoms, continue to monitor at home and seek medical attention if your symptoms worsen. . If you are having a medical emergency, call 911.   ADDITIONAL HEALTHCARE OPTIONS FOR PATIENTS  [facility] / e-Visit: [email or web address]         [facility]: [phone]  [facility]: [phone]                   [facility]: [phone]   Spoke with pt denies any sx.  Amy Clontz, CMA 

## 2019-03-17 ENCOUNTER — Encounter: Payer: BLUE CROSS/BLUE SHIELD | Admitting: Certified Nurse Midwife

## 2019-03-24 ENCOUNTER — Encounter: Payer: Medicaid Other | Admitting: Certified Nurse Midwife

## 2019-03-25 ENCOUNTER — Other Ambulatory Visit: Payer: Self-pay

## 2019-03-25 DIAGNOSIS — Z20822 Contact with and (suspected) exposure to covid-19: Secondary | ICD-10-CM

## 2019-03-25 NOTE — Progress Notes (Signed)
 [facility] referred pt. For COVID testing; orders placed.

## 2019-03-26 ENCOUNTER — Other Ambulatory Visit

## 2019-03-26 DIAGNOSIS — Z20822 Contact with and (suspected) exposure to covid-19: Secondary | ICD-10-CM

## 2019-03-31 LAB — NOVEL CORONAVIRUS, NAA: SARS-CoV-2, NAA: NOT DETECTED

## 2019-04-07 ENCOUNTER — Telehealth: Payer: Self-pay

## 2019-04-07 NOTE — Telephone Encounter (Signed)
Coronavirus (COVID-19) Are you at risk?  Are you at risk for the Coronavirus (COVID-19)?  To be considered HIGH RISK for Coronavirus (COVID-19), you have to meet the following criteria:  . Traveled to [place], [place], [place], [place] or [place]; or in the [place] to [place], [place], [place], or [place]; and have fever, cough, and shortness of breath within the last 2 weeks of travel OR . Been in close contact with a person diagnosed with COVID-19 within the last 2 weeks and have fever, cough, and shortness of breath . IF YOU DO NOT MEET THESE CRITERIA, YOU ARE CONSIDERED LOW RISK FOR COVID-19.  What to do if you are HIGH RISK for COVID-19?  . If you are having a medical emergency, call 911. . Seek medical care right away. Before you go to a doctor's office, urgent care or emergency department, call ahead and tell them about your recent travel, contact with someone diagnosed with COVID-19, and your symptoms. You should receive instructions from your physician's office regarding next steps of care.  . When you arrive at healthcare provider, tell the healthcare staff immediately you have returned from visiting [place], [place], [place], [place] or [place]; or traveled in the [place] to [place], [place], [place], or [place]; in the last two weeks or you have been in close contact with a person diagnosed with COVID-19 in the last 2 weeks.   . Tell the health care staff about your symptoms: fever, cough and shortness of breath. . After you have been seen by a medical provider, you will be either: o Tested for (COVID-19) and discharged home on quarantine except to seek medical care if symptoms worsen, and asked to  - Stay home and avoid contact with others until you get your results (4-5 days)  - Avoid travel on public transportation if possible (such as bus, train, or airplane) or o Sent to the Emergency Department by EMS for evaluation, COVID-19 testing, and possible  admission depending on your condition and test results.  What to do if you are LOW RISK for COVID-19?  Reduce your risk of any infection by using the same precautions used for avoiding the common cold or flu:  . Wash your hands often with soap and warm water for at least 20 seconds.  If soap and water are not readily available, use an alcohol-based hand sanitizer with at least 60% alcohol.  . If coughing or sneezing, cover your mouth and nose by coughing or sneezing into the elbow areas of your shirt or coat, into a tissue or into your sleeve (not your hands). . Avoid shaking hands with others and consider head nods or verbal greetings only. . Avoid touching your eyes, nose, or mouth with unwashed hands.  . Avoid close contact with people who are Rebekha Diveley. . Avoid places or events with large numbers of people in one location, like concerts or sporting events. . Carefully consider travel plans you have or are making. . If you are planning any travel outside or inside the [place], visit the CDC's Travelers' Health webpage for the latest health notices. . If you have some symptoms but not all symptoms, continue to monitor at home and seek medical attention if your symptoms worsen. . If you are having a medical emergency, call 911.  04/07/19 SCREENING NEG [facility] ADDITIONAL HEALTHCARE OPTIONS FOR PATIENTS  [facility] / e-Visit: [email or web address]         [facility]: [phone]    [facility]: [phone]                   [facility]: [phone]  

## 2019-04-08 ENCOUNTER — Encounter: Payer: Medicaid Other | Admitting: Certified Nurse Midwife

## 2019-04-08 ENCOUNTER — Other Ambulatory Visit: Payer: Self-pay

## 2019-04-08 ENCOUNTER — Encounter: Payer: Self-pay | Admitting: Certified Nurse Midwife

## 2019-04-08 ENCOUNTER — Ambulatory Visit (INDEPENDENT_AMBULATORY_CARE_PROVIDER_SITE_OTHER): Payer: Medicaid Other | Admitting: Certified Nurse Midwife

## 2019-04-08 VITALS — BP 120/74 | HR 85 | Ht 62.0 in | Wt 136.2 lb

## 2019-04-08 DIAGNOSIS — Z0000 Encounter for general adult medical examination without abnormal findings: Secondary | ICD-10-CM

## 2019-04-08 DIAGNOSIS — Z01419 Encounter for gynecological examination (general) (routine) without abnormal findings: Secondary | ICD-10-CM

## 2019-04-08 NOTE — Patient Instructions (Signed)
Preventive Care 21-26 Years Old, Female Preventive care refers to visits with your health care provider and lifestyle choices that can promote health and wellness. This includes:  A yearly physical exam. This may also be called an annual well check.  Regular dental visits and eye exams.  Immunizations.  Screening for certain conditions.  Healthy lifestyle choices, such as eating a healthy diet, getting regular exercise, not using drugs or products that contain nicotine and tobacco, and limiting alcohol use. What can I expect for my preventive care visit? Physical exam Your health care provider will check your:  Height and weight. This may be used to calculate body mass index (BMI), which tells if you are at a healthy weight.  Heart rate and blood pressure.  Skin for abnormal spots. Counseling Your health care provider may ask you questions about your:  Alcohol, tobacco, and drug use.  Emotional well-being.  Home and relationship well-being.  Sexual activity.  Eating habits.  Work and work environment.  Method of birth control.  Menstrual cycle.  Pregnancy history. What immunizations do I need?  Influenza (flu) vaccine  This is recommended every year. Tetanus, diphtheria, and pertussis (Tdap) vaccine  You may need a Td booster every 10 years. Varicella (chickenpox) vaccine  You may need this if you have not been vaccinated. Human papillomavirus (HPV) vaccine  If recommended by your health care provider, you may need three doses over 6 months. Measles, mumps, and rubella (MMR) vaccine  You may need at least one dose of MMR. You may also need a second dose. Meningococcal conjugate (MenACWY) vaccine  One dose is recommended if you are age 19-21 years and a first-year college student living in a residence hall, or if you have one of several medical conditions. You may also need additional booster doses. Pneumococcal conjugate (PCV13) vaccine  You may need  this if you have certain conditions and were not previously vaccinated. Pneumococcal polysaccharide (PPSV23) vaccine  You may need one or two doses if you smoke cigarettes or if you have certain conditions. Hepatitis A vaccine  You may need this if you have certain conditions or if you travel or work in places where you may be exposed to hepatitis A. Hepatitis B vaccine  You may need this if you have certain conditions or if you travel or work in places where you may be exposed to hepatitis B. Haemophilus influenzae type b (Hib) vaccine  You may need this if you have certain conditions. You may receive vaccines as individual doses or as more than one vaccine together in one shot (combination vaccines). Talk with your health care provider about the risks and benefits of combination vaccines. What tests do I need?  Blood tests  Lipid and cholesterol levels. These may be checked every 5 years starting at age 20.  Hepatitis C test.  Hepatitis B test. Screening  Diabetes screening. This is done by checking your blood sugar (glucose) after you have not eaten for a while (fasting).  Sexually transmitted disease (STD) testing.  BRCA-related cancer screening. This may be done if you have a family history of breast, ovarian, tubal, or peritoneal cancers.  Pelvic exam and Pap test. This may be done every 3 years starting at age 21. Starting at age 30, this may be done every 5 years if you have a Pap test in combination with an HPV test. Talk with your health care provider about your test results, treatment options, and if necessary, the need for more tests.   Follow these instructions at home: Eating and drinking   Eat a diet that includes fresh fruits and vegetables, whole grains, lean protein, and low-fat dairy.  Take vitamin and mineral supplements as recommended by your health care provider.  Do not drink alcohol if: ? Your health care provider tells you not to drink. ? You are  pregnant, may be pregnant, or are planning to become pregnant.  If you drink alcohol: ? Limit how much you have to 0-1 drink a day. ? Be aware of how much alcohol is in your drink. In the [place] one drink equals one 12 oz bottle of beer (355 mL), one 5 oz glass of wine (148 mL), or one 1 oz glass of hard liquor (44 mL). Lifestyle  Take daily care of your teeth and gums.  Stay active. Exercise for at least 30 minutes on 5 or more days each week.  Do not use any products that contain nicotine or tobacco, such as cigarettes, e-cigarettes, and chewing tobacco. If you need help quitting, ask your health care provider.  If you are sexually active, practice safe sex. Use a condom or other form of birth control (contraception) in order to prevent pregnancy and STIs (sexually transmitted infections). If you plan to become pregnant, see your health care provider for a preconception visit. What's next?  Visit your health care provider once a year for a well check visit.  Ask your health care provider how often you should have your eyes and teeth checked.  Stay up to date on all vaccines. This information is not intended to replace advice given to you by your health care provider. Make sure you discuss any questions you have with your health care provider. Document Released: 10/31/2001 Document Revised: 05/16/2018 Document Reviewed: 05/16/2018 Elsevier Patient Education  2020 Elsevier Inc.  

## 2019-04-08 NOTE — Progress Notes (Signed)
GYNECOLOGY ANNUAL PREVENTATIVE CARE ENCOUNTER NOTE  History:     Znya Albino is a 28 y.o. G84P1001 female here for a routine annual gynecologic exam.  Current complaints: none.   Denies abnormal vaginal bleeding, discharge, pelvic pain, problems with intercourse or other gynecologic concerns.    Gynecologic History Patient's last menstrual period was 03/31/2019 (exact date). Contraception: OCP (estrogen/progesterone) Last Pap: 02/13/2017. Results were: normal Last mammogram: n/a   Obstetric History OB History  Gravida Para Term Preterm AB Living  1 1 1     1   SAB TAB Ectopic Multiple Live Births          1    # Outcome Date GA Lbr Len/2nd Weight Sex Delivery Anes PTL Lv  1 Term 10/27/17 [redacted]w[redacted]d  6 lb 11 oz (3.033 kg) F Vag-Spont  N LIV    No past medical history on file.  Past Surgical History:  Procedure Laterality Date  . NO PAST SURGERIES      Current Outpatient Medications on File Prior to Visit  Medication Sig Dispense Refill  . Multiple Vitamin (MULTIVITAMIN) tablet Take 1 tablet by mouth daily.    . naproxen (NAPROSYN) 500 MG tablet Take 1 tablet (500 mg total) by mouth 2 (two) times daily with a meal. 20 tablet 2  . norethindrone-ethinyl estradiol (LOESTRIN) 1-20 MG-MCG tablet Take 1 tablet by mouth daily. 3 Package 0  . ondansetron (ZOFRAN ODT) 4 MG disintegrating tablet Take 1 tablet (4 mg total) by mouth every 6 (six) hours as needed for nausea. 20 tablet 0  . triamcinolone cream (KENALOG) 0.1 % Apply 1 application topically 2 (two) times daily. 30 g 0   No current facility-administered medications on file prior to visit.     No Known Allergies  Social History:  reports that she has never smoked. She has never used smokeless tobacco. She reports that she does not drink alcohol or use drugs.  Family History  Problem Relation Age of Onset  . Healthy Mother   . Healthy Father   . Cancer Paternal Grandmother        Lymphoma  . Stroke Paternal Grandmother         great  . Cancer Paternal Aunt        (great) breast cancer    The following portions of the patient's history were reviewed and updated as appropriate: allergies, current medications, past family history, past medical history, past social history, past surgical history and problem list.  Review of Systems Pertinent items noted in HPI and remainder of comprehensive ROS otherwise negative.  Physical Exam:  BP 120/74   Pulse 85   Ht 5\' 2"  (1.575 m)   Wt 136 lb 3 oz (61.8 kg)   LMP 03/31/2019 (Exact Date)   BMI 24.91 kg/m  CONSTITUTIONAL: Well-developed, well-nourished female in no acute distress.  HENT:  Normocephalic, atraumatic, External right and left ear normal. Oropharynx is clear and moist EYES: Conjunctivae and EOM are normal. Pupils are equal, round, and reactive to light. No scleral icterus.  NECK: Normal range of motion, supple, no masses.  Normal thyroid.  SKIN: Skin is warm and dry. No rash noted. Not diaphoretic. No erythema. No pallor. MUSCULOSKELETAL: Normal range of motion. No tenderness.  No cyanosis, clubbing, or edema.  2+ distal pulses. NEUROLOGIC: Alert and oriented to person, place, and time. Normal reflexes, muscle tone coordination. No cranial nerve deficit noted. PSYCHIATRIC: Normal mood and affect. Normal behavior. Normal judgment and thought content. CARDIOVASCULAR: Normal heart rate  noted, regular rhythm RESPIRATORY: Clear to auscultation bilaterally. Effort and breath sounds normal, no problems with respiration noted. BREASTS: Symmetric in size. No masses, skin changes, nipple drainage, or lymphadenopathy. ABDOMEN: Soft, normal bowel sounds, no distention noted.  No tenderness, rebound or guarding.  PELVIC: Normal appearing external genitalia; normal appearing vaginal mucosa and cervix.  No abnormal discharge noted. Ectropion present Pap smear not indicated Normal uterine size, no other palpable masses, no uterine or adnexal tenderness.   Assessment  and Plan:    Annual Well Women GYN exam  Pap smear not indicated Mammogram not indicated Labs: none Birth control options discussed . She states she sometimes forgets to take her pill but since her partner is deployed it has not been an issue. Reviewed BC options. She is considering IUD or Nuva ring. She will let me know.  Routine preventative health maintenance measures emphasized. Please refer to After Visit Summary for other counseling recommendations.      Doreene BurkeAnnie Janyra Barillas, CNM

## 2019-05-16 ENCOUNTER — Telehealth: Payer: Self-pay

## 2019-05-16 NOTE — Telephone Encounter (Signed)
Coronavirus (COVID-19) Are you at risk?  Are you at risk for the Coronavirus (COVID-19)?  To be considered HIGH RISK for Coronavirus (COVID-19), you have to meet the following criteria:  . Traveled to [place], [place], [place], [place] or [place]; or in the [place] to [place], [place], [place], or [place]; and have fever, cough, and shortness of breath within the last 2 weeks of travel OR . Been in close contact with a person diagnosed with COVID-19 within the last 2 weeks and have fever, cough, and shortness of breath . IF YOU DO NOT MEET THESE CRITERIA, YOU ARE CONSIDERED LOW RISK FOR COVID-19.  What to do if you are HIGH RISK for COVID-19?  . If you are having a medical emergency, call 911. . Seek medical care right away. Before you go to a doctor's office, urgent care or emergency department, call ahead and tell them about your recent travel, contact with someone diagnosed with COVID-19, and your symptoms. You should receive instructions from your physician's office regarding next steps of care.  . When you arrive at healthcare provider, tell the healthcare staff immediately you have returned from visiting [place], [place], [place], [place] or [place]; or traveled in the [place] to [place], [place], [place], or [place]; in the last two weeks or you have been in close contact with a person diagnosed with COVID-19 in the last 2 weeks.   . Tell the health care staff about your symptoms: fever, cough and shortness of breath. . After you have been seen by a medical provider, you will be either: o Tested for (COVID-19) and discharged home on quarantine except to seek medical care if symptoms worsen, and asked to  - Stay home and avoid contact with others until you get your results (4-5 days)  - Avoid travel on public transportation if possible (such as bus, train, or airplane) or o Sent to the Emergency Department by EMS for evaluation, COVID-19 testing, and possible  admission depending on your condition and test results.  What to do if you are LOW RISK for COVID-19?  Reduce your risk of any infection by using the same precautions used for avoiding the common cold or flu:  . Wash your hands often with soap and warm water for at least 20 seconds.  If soap and water are not readily available, use an alcohol-based hand sanitizer with at least 60% alcohol.  . If coughing or sneezing, cover your mouth and nose by coughing or sneezing into the elbow areas of your shirt or coat, into a tissue or into your sleeve (not your hands). . Avoid shaking hands with others and consider head nods or verbal greetings only. . Avoid touching your eyes, nose, or mouth with unwashed hands.  . Avoid close contact with people who are Rose Oneal. . Avoid places or events with large numbers of people in one location, like concerts or sporting events. . Carefully consider travel plans you have or are making. . If you are planning any travel outside or inside the [place], visit the CDC's Travelers' Health webpage for the latest health notices. . If you have some symptoms but not all symptoms, continue to monitor at home and seek medical attention if your symptoms worsen. . If you are having a medical emergency, call 911.  05/16/19 SCREENING NEG SLS ADDITIONAL HEALTHCARE OPTIONS FOR PATIENTS  [facility] / e-Visit: [email or web address]         [facility]: [phone]    [facility]: [phone]                   [facility]: [phone]  

## 2019-05-19 ENCOUNTER — Ambulatory Visit (INDEPENDENT_AMBULATORY_CARE_PROVIDER_SITE_OTHER): Payer: Medicaid Other | Admitting: Certified Nurse Midwife

## 2019-05-19 ENCOUNTER — Other Ambulatory Visit: Payer: Self-pay

## 2019-05-19 ENCOUNTER — Encounter: Payer: Self-pay | Admitting: Certified Nurse Midwife

## 2019-05-19 VITALS — BP 119/71 | HR 84 | Ht 62.0 in | Wt 135.3 lb

## 2019-05-19 DIAGNOSIS — Z3202 Encounter for pregnancy test, result negative: Secondary | ICD-10-CM | POA: Diagnosis not present

## 2019-05-19 DIAGNOSIS — Z30430 Encounter for insertion of intrauterine contraceptive device: Secondary | ICD-10-CM | POA: Diagnosis not present

## 2019-05-19 LAB — POCT URINE PREGNANCY: Preg Test, Ur: NEGATIVE

## 2019-05-19 NOTE — Progress Notes (Signed)
  GYNECOLOGY OFFICE PROCEDURE NOTE  Rose Oneal is a 28 y.o. G1P1001 here for [facility]  IUD insertion. No GYN concerns.  Last pap smear was on 02/13/2017 and was normal.  IUD Insertion Procedure Note Patient identified, informed consent performed, consent signed.   Discussed risks of irregular bleeding, cramping, infection, malpositioning or misplacement of the IUD outside the uterus which may require further procedure such as laparoscopy. Also discussed >99% contraception efficacy, increased risk of ectopic pregnancy with failure of method.  Time out was performed.  Urine pregnancy test negative.  Speculum placed in the vagina.  Cervix visualized.  Cleaned with Betadine x 2.  Grasped anteriorly with a single tooth tenaculum.  Uterus sounded to 7 cm.  Skyla IUD placed per manufacturer's recommendations.  Strings trimmed to 3 cm. Tenaculum was removed, good hemostasis noted.  Patient tolerated procedure well.   Patient was given post-procedure instructions.  She was advised to have backup contraception for one week.  Patient was also asked to check IUD strings periodically and follow up in 4 weeks for IUD check.   Philip Aspen, CNM

## 2019-05-19 NOTE — Patient Instructions (Signed)
Intrauterine Device Insertion, Care After  This sheet gives you information about how to care for yourself after your procedure. Your health care provider may also give you more specific instructions. If you have problems or questions, contact your health care provider. What can I expect after the procedure? After the procedure, it is common to have:  Cramps and pain in the abdomen.  Light bleeding (spotting) or heavier bleeding that is like your menstrual period. This may last for up to a few days.  Lower back pain.  Dizziness.  Headaches.  Nausea. Follow these instructions at home:  Before resuming sexual activity, check to make sure that you can feel the IUD string(s). You should be able to feel the end of the string(s) below the opening of your cervix. If your IUD string is in place, you may resume sexual activity. ? If you had a hormonal IUD inserted more than 7 days after your most recent period started, you will need to use a backup method of birth control for 7 days after IUD insertion. Ask your health care provider whether this applies to you.  Continue to check that the IUD is still in place by feeling for the string(s) after every menstrual period, or once a month.  Take over-the-counter and prescription medicines only as told by your health care provider.  Do not drive or use heavy machinery while taking prescription pain medicine.  Keep all follow-up visits as told by your health care provider. This is important. Contact a health care provider if:  You have bleeding that is heavier or lasts longer than a normal menstrual cycle.  You have a fever.  You have cramps or abdominal pain that get worse or do not get better with medicine.  You develop abdominal pain that is new or is not in the same area of earlier cramping and pain.  You feel lightheaded or weak.  You have abnormal or bad-smelling discharge from your vagina.  You have pain during sexual activity.   You have any of the following problems with your IUD string(s): ? The string bothers or hurts you or your sexual partner. ? You cannot feel the string. ? The string has gotten longer.  You can feel the IUD in your vagina.  You think you may be pregnant, or you miss your menstrual period.  You think you may have an STI (sexually transmitted infection). Get help right away if:  You have flu-like symptoms.  You have a fever and chills.  You can feel that your IUD has slipped out of place. Summary  After the procedure, it is common to have cramps and pain in the abdomen. It is also common to have light bleeding (spotting) or heavier bleeding that is like your menstrual period.  Continue to check that the IUD is still in place by feeling for the string(s) after every menstrual period, or once a month.  Keep all follow-up visits as told by your health care provider. This is important.  Contact your health care provider if you have problems with your IUD string(s), such as the string getting longer or bothering you or your sexual partner. This information is not intended to replace advice given to you by your health care provider. Make sure you discuss any questions you have with your health care provider. Document Released: 05/03/2011 Document Revised: 08/17/2017 Document Reviewed: 07/26/2016 Elsevier Patient Education  2020 Elsevier Inc.  

## 2019-06-16 ENCOUNTER — Encounter: Payer: Self-pay | Admitting: Certified Nurse Midwife

## 2019-06-16 ENCOUNTER — Ambulatory Visit (INDEPENDENT_AMBULATORY_CARE_PROVIDER_SITE_OTHER): Admitting: Certified Nurse Midwife

## 2019-06-16 ENCOUNTER — Other Ambulatory Visit: Payer: Self-pay

## 2019-06-16 VITALS — BP 115/74 | HR 70 | Ht 62.0 in | Wt 136.6 lb

## 2019-06-16 DIAGNOSIS — Z30431 Encounter for routine checking of intrauterine contraceptive device: Secondary | ICD-10-CM

## 2019-06-16 NOTE — Patient Instructions (Signed)

## 2019-06-16 NOTE — Progress Notes (Signed)
     GYNECOLOGY OFFICE ENCOUNTER NOTE  History:  28 y.o. G1P1001 here today for today for IUD string check; Skyla  IUD was placed  05/19/19. No complaints about the IUD, no concerning side effects.  The following portions of the patient's history were reviewed and updated as appropriate: allergies, current medications, past family history, past medical history, past social history, past surgical history and problem list. Last pap smear on 02/13/17 was normal, negative.  Review of Systems:  Pertinent items are noted in HPI.   Objective:  Physical Exam not currently breastfeeding. CONSTITUTIONAL: Well-developed, well-nourished female in no acute distress.  HENT:  Normocephalic, atraumatic. External right and left ear normal. Oropharynx is clear and moist EYES: Conjunctivae and EOM are normal. Pupils are equal, round, and reactive to light. No scleral icterus.  NECK: Normal range of motion, supple, no masses CARDIOVASCULAR: Normal heart rate noted RESPIRATORY: Effort and breath sounds normal, no problems with respiration noted ABDOMEN: Soft, no distention noted.   PELVIC: Normal appearing external genitalia; normal appearing vaginal mucosa and cervix.  IUD strings visualized, about 3 cm in length outside cervix.   Assessment & Plan:  Patient to keep IUD in place for up to 3 years; can come in for removal if she desires pregnancy earlier or for any concerning side effects.  Philip Aspen, CNM

## 2020-04-12 ENCOUNTER — Encounter: Admitting: Certified Nurse Midwife

## 2020-12-10 IMAGING — CT CT RENAL STONE PROTOCOL
3 of 4 series · 7 of 46 positions shown, 13 images · non-contrast
Comparison: None.

CLINICAL DATA: Lower abdominal pain 1 hour. Rule out kidney stone.
Negative pregnancy test

EXAM:
CT ABDOMEN AND PELVIS WITHOUT CONTRAST
TECHNIQUE: Multidetector CT imaging of the abdomen and pelvis was performed
following the standard protocol without IV contrast.

[Series 4: lung bases · axial · 0.52mm/px · z∈[-29,+1]mm · 3 of 14 slices shown, 7 images]
[im 4/14  soft-tissue]
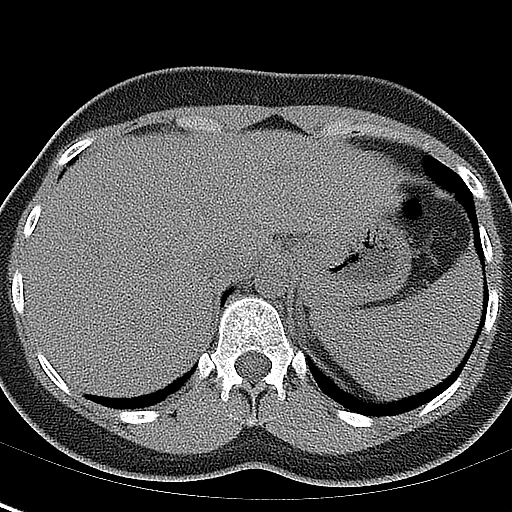
[im 4/14  lung]
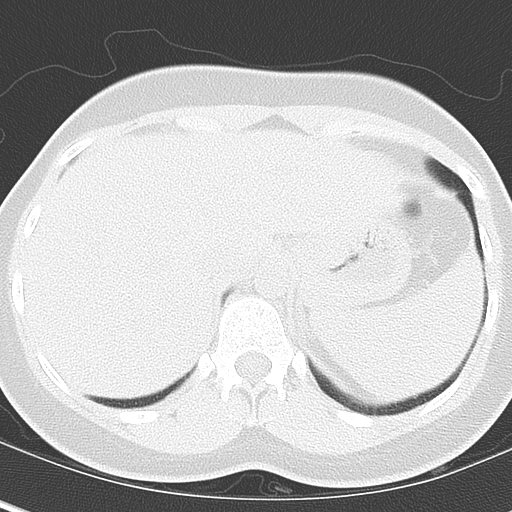
[im 4/14  bone]
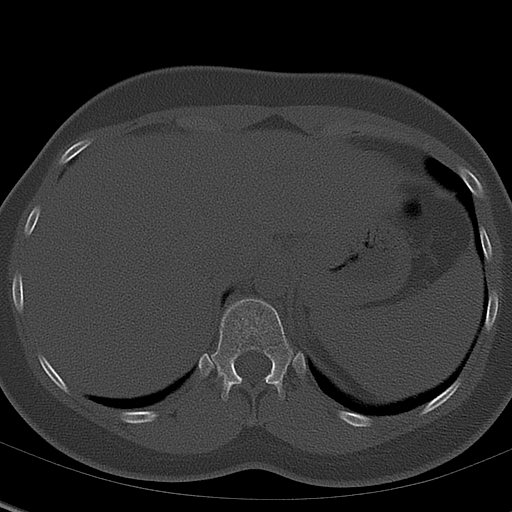
[im 7/14  soft-tissue]
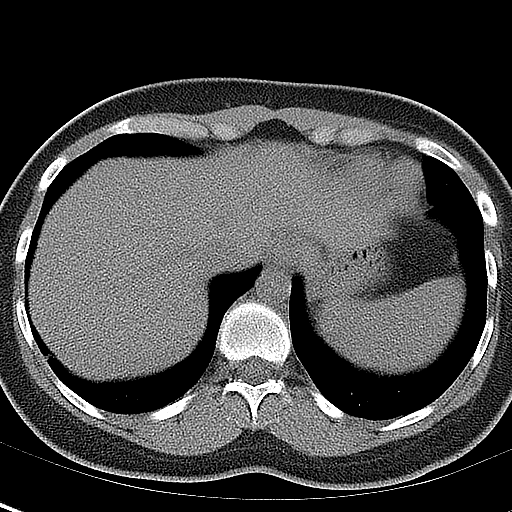
[im 7/14  lung]
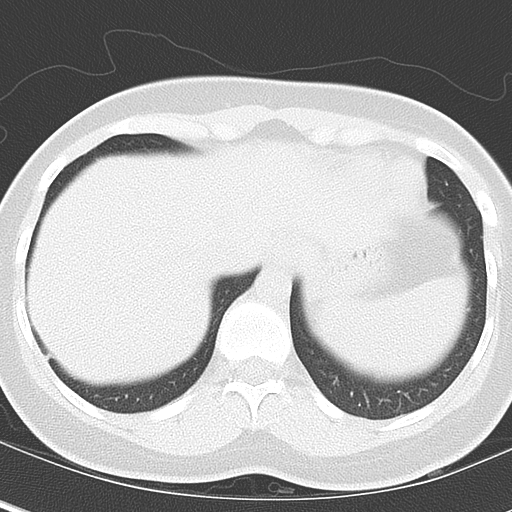
[im 10/14  soft-tissue]
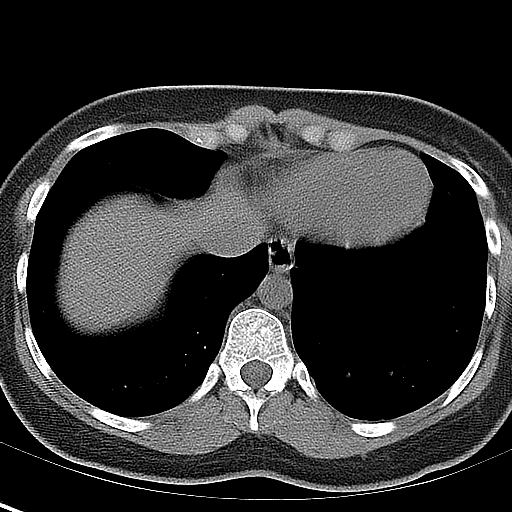
[im 10/14  lung]
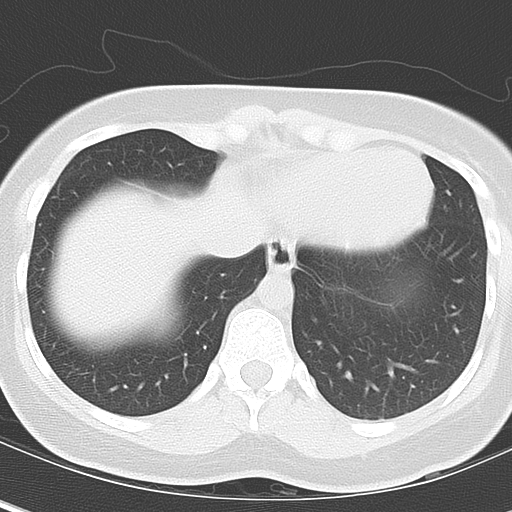

[Series 5: coronal · coronal · 0.65mm/px · 3 of 106 slices shown, 4 images]
[im 36/106  soft-tissue]
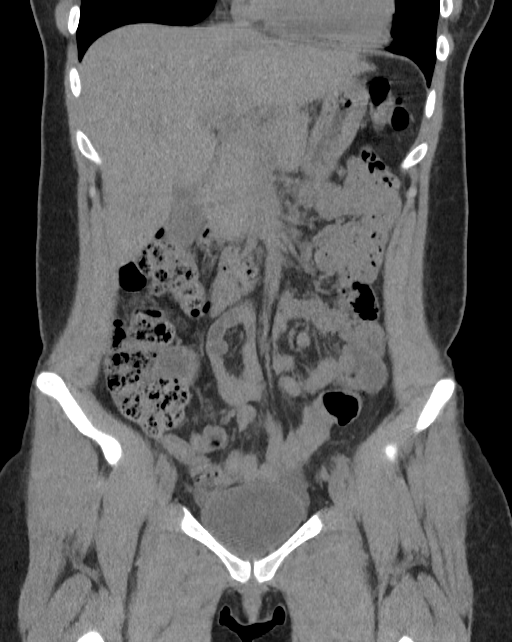
[im 47/106  soft-tissue]
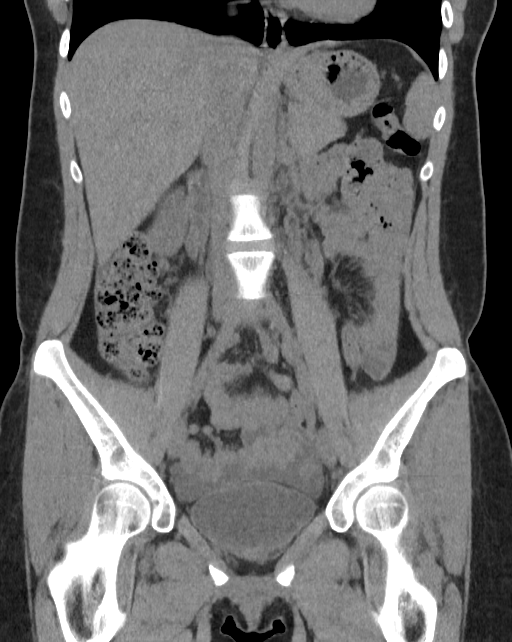
[im 47/106  bone]
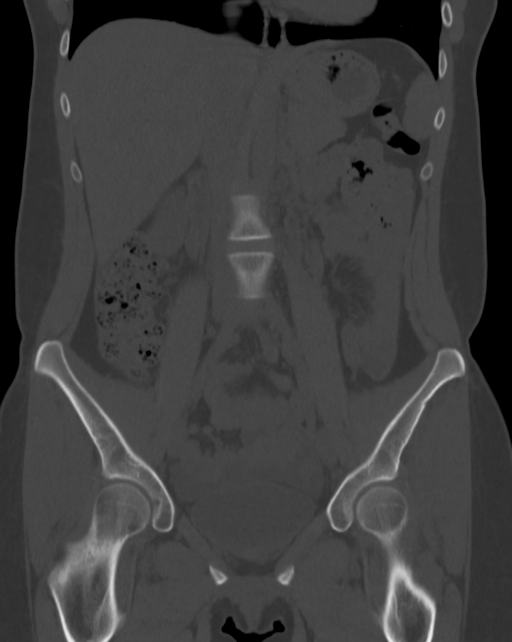
[im 59/106  soft-tissue]
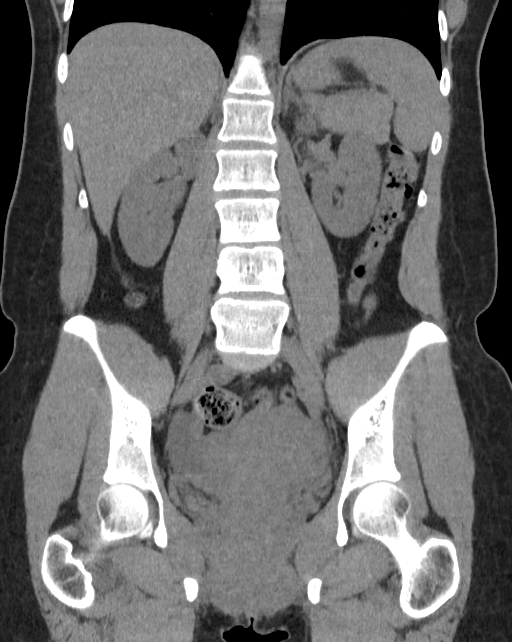

[Series 6: sagittal · sagittal · 0.45mm/px · 1 of 164 slices shown, 2 images]
[im 55/164  soft-tissue]
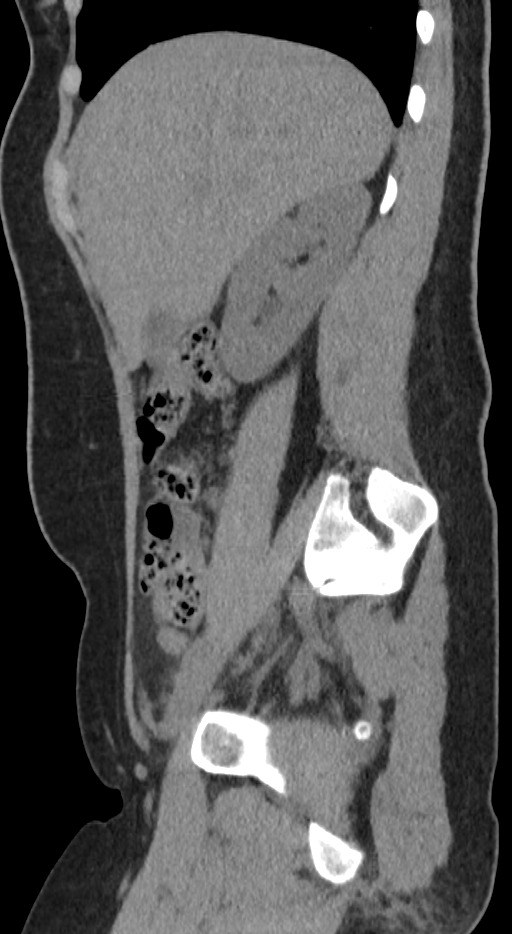
[im 55/164  bone]
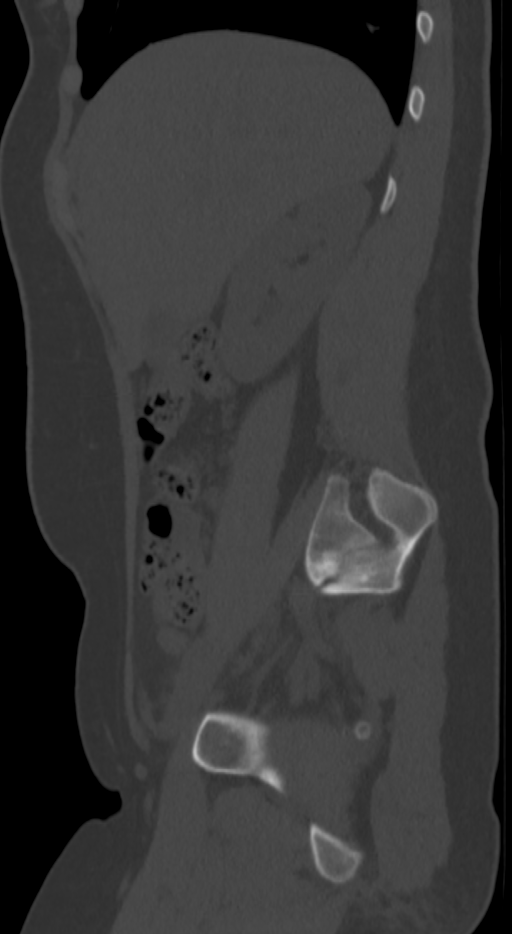

[7 of 46 positions shown; findings below may reference images not displayed]

FINDINGS: Lower chest: Lung bases clear bilaterally

Hepatobiliary: No focal liver abnormality is seen. No gallstones,
gallbladder wall thickening, or biliary dilatation.

Pancreas: Negative

Spleen: Negative

Adrenals/Urinary Tract: Adrenal glands are unremarkable. Kidneys are
normal, without renal calculi, focal lesion, or hydronephrosis.
Bladder is unremarkable.

Stomach/Bowel: Negative for bowel obstruction. No bowel mass or
edema. Normal appendix

Vascular/Lymphatic: No significant vascular findings are present. No
enlarged abdominal or pelvic lymph nodes.

Reproductive: Normal uterus. No pelvic mass. Moderate free fluid in
the pelvis right greater than left.

Other: None

Musculoskeletal: Negative
IMPRESSION: Negative for urinary tract calculi

Moderate free fluid in the pelvis. Question ruptured ovarian cyst on
the right. Pelvic infection also possible.

## 2021-01-17 IMAGING — US US PELVIS COMPLETE
1 series · 13 of 25 positions shown · non-contrast
Comparison: Prior CT from 10/21/2017

CLINICAL DATA: Initial evaluation for acute pelvic pain. Fluid in
pelvis.

EXAM:
TRANSABDOMINAL AND TRANSVAGINAL ULTRASOUND OF PELVIS
DOPPLER ULTRASOUND OF OVARIES
TECHNIQUE: Both transabdominal and transvaginal ultrasound examinations of the
pelvis were performed. Transabdominal technique was performed for
global imaging of the pelvis including uterus, ovaries, adnexal
regions, and pelvic cul-de-sac.
It was necessary to proceed with endovaginal exam following the
transabdominal exam to visualize the uterus, endometrium, and
ovaries. Color and duplex Doppler ultrasound was utilized to
evaluate blood flow to the ovaries.

[Series 1: us pelvis complete · 0.21mm/px · 13 of 84 slices shown]
[im 1/84]
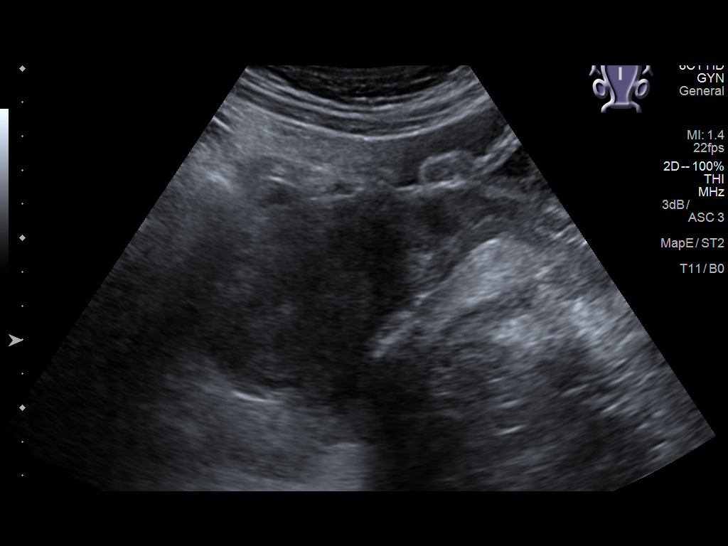
[im 7/84]
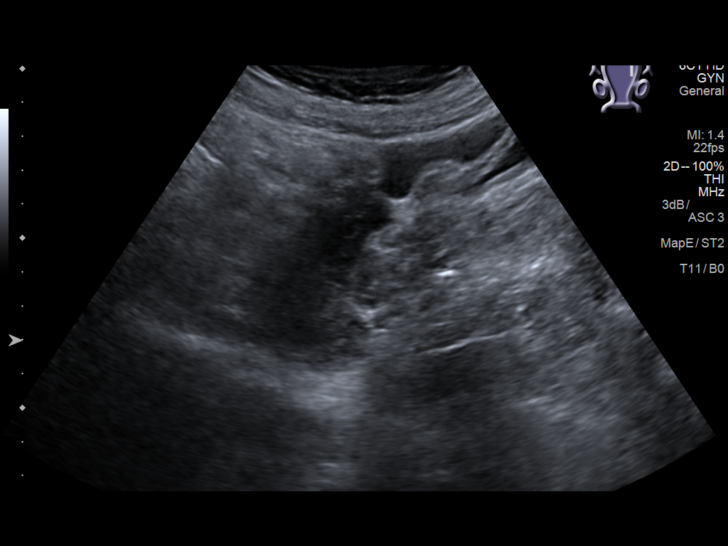
[im 14/84]
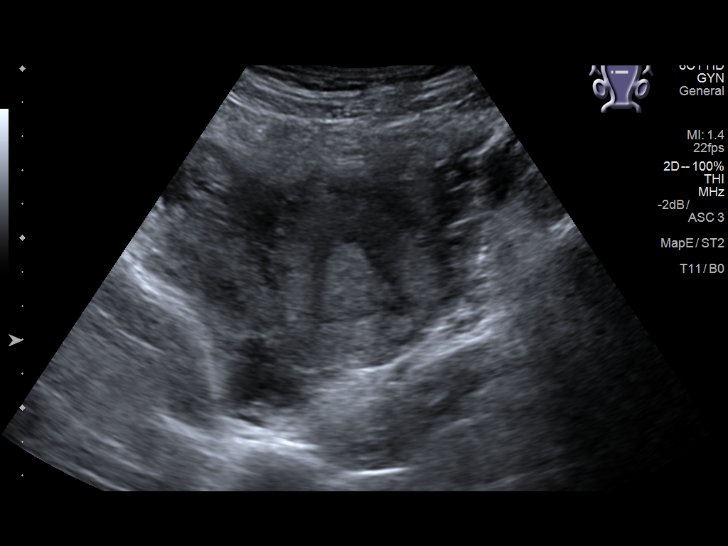
[im 21/84]
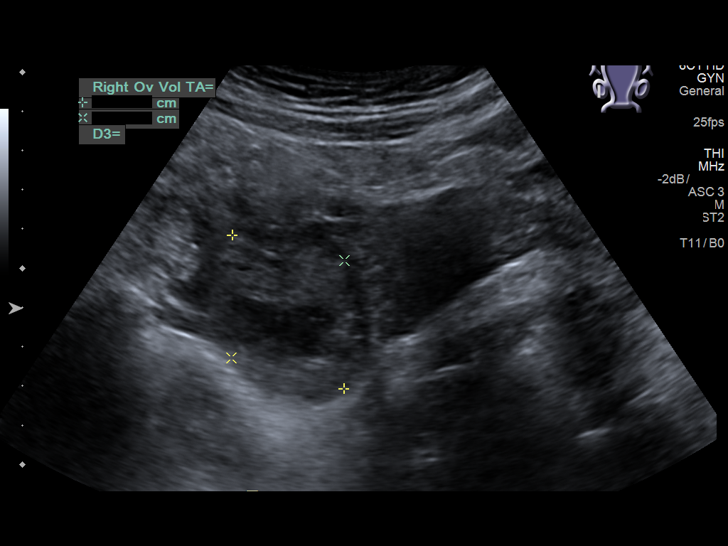
[im 28/84]
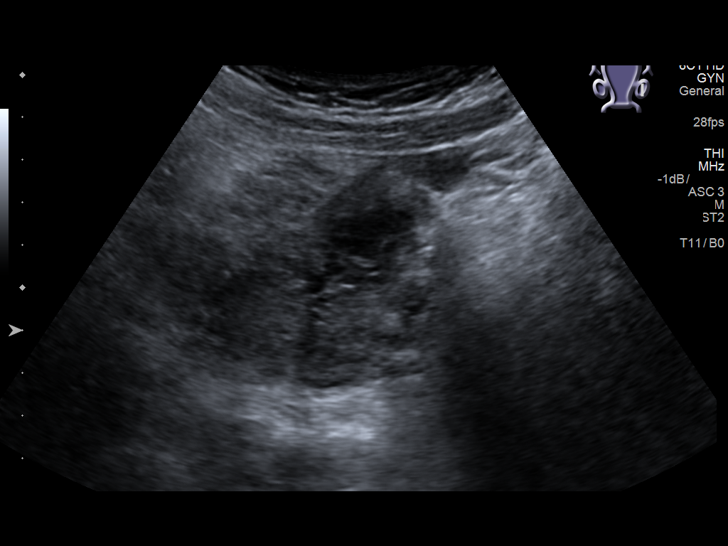
[im 35/84]
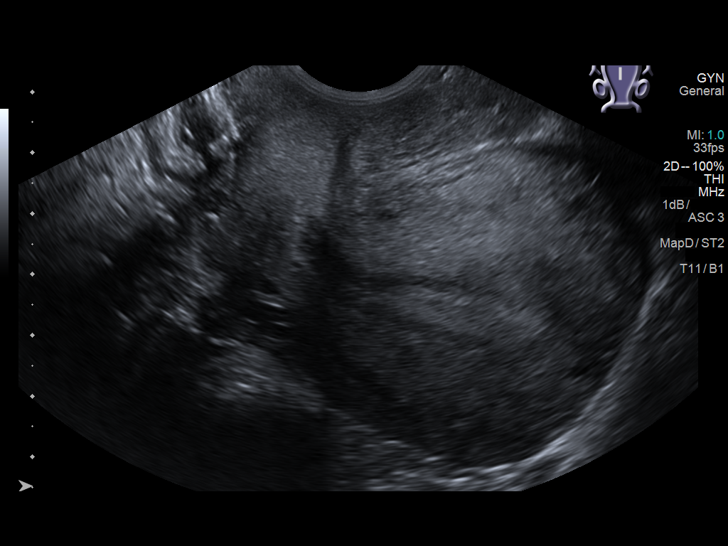
[im 42/84]
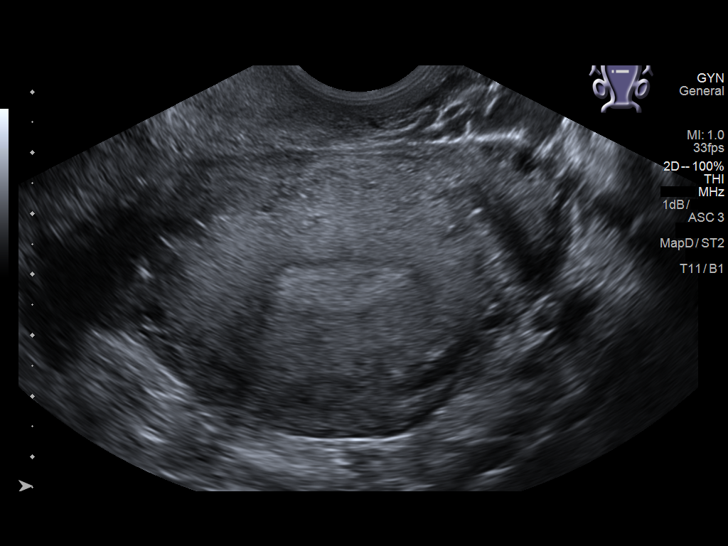
[im 49/84]
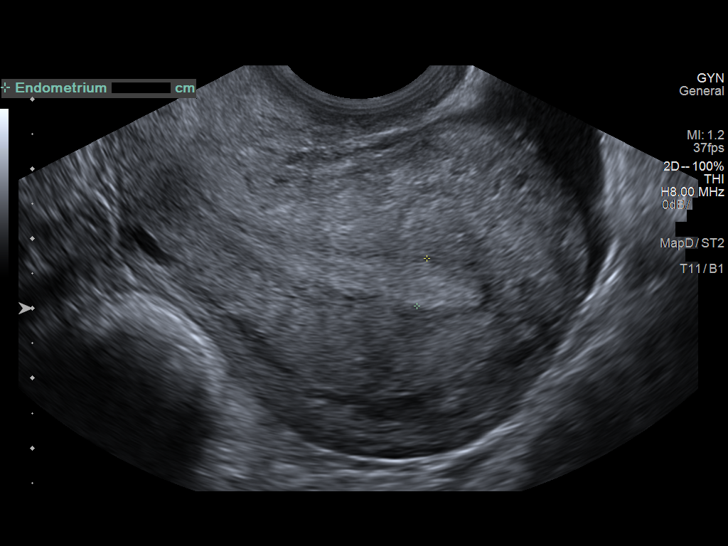
[im 56/84]
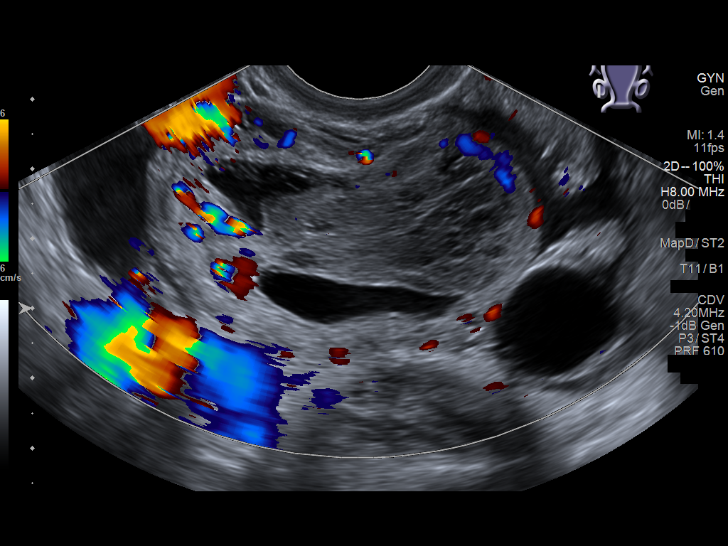
[im 63/84]
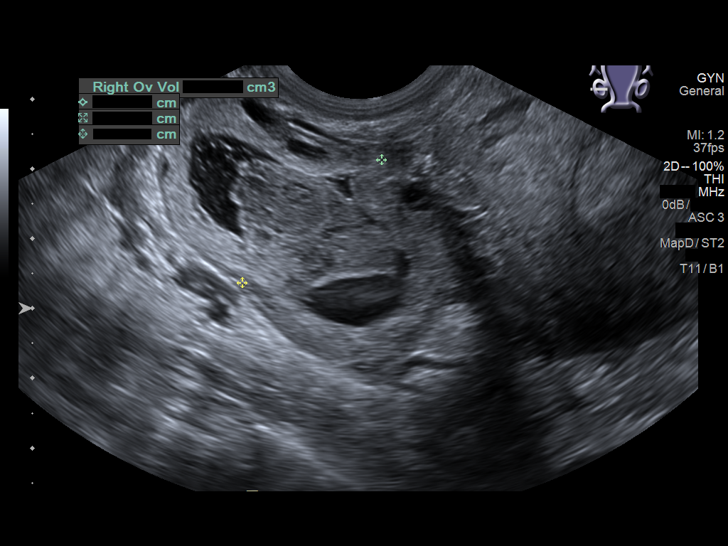
[im 70/84]
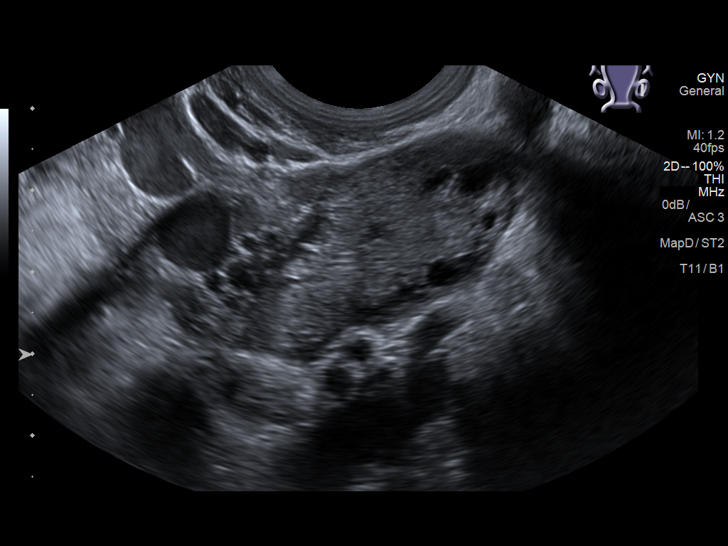
[im 77/84]
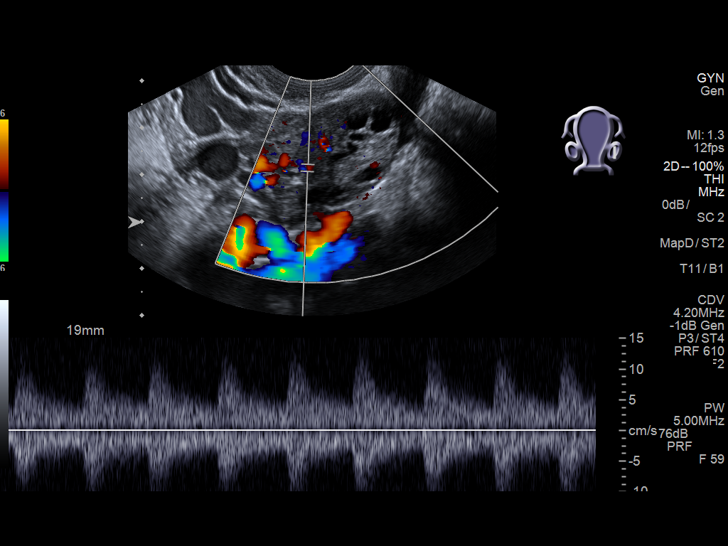
[im 84/84]
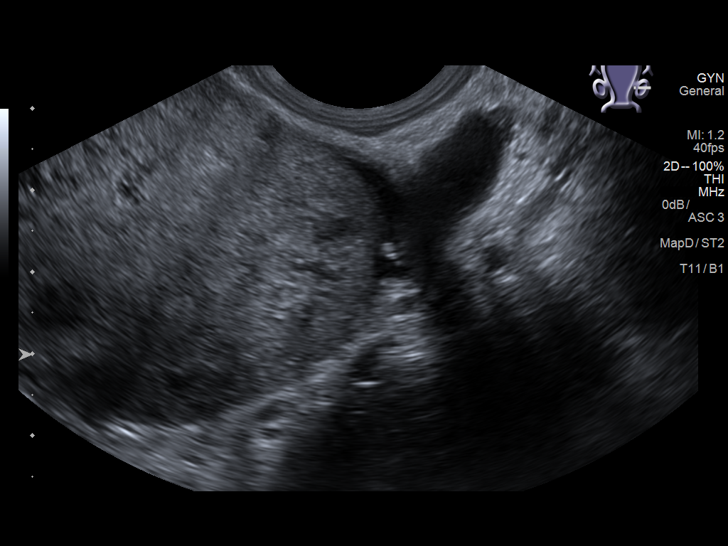

[13 of 25 positions shown; findings below may reference images not displayed]

FINDINGS: Uterus

Measurements: 8.5 x 4.8 x 5.7 cm = volume: 120 mL. No fibroids or
other mass visualized.

Endometrium

Thickness: 7 mm.  No focal abnormality visualized.

Right ovary

Measurements: 5.4 x 3.5 x 2.7 cm = volume: 26 mL. Complex hypoechoic
lesion measuring approximately 4.5 cm in length present within the
right ovary. Internal lace-like architecture with scattered
echogenic material, likely a hemorrhagic cyst with internal clot. No
definite internal vascularity. Adjacent 1.6 cm simple paraovarian
cyst noted, of doubtful significance.

Left ovary

Measurements: 3.7 x 1.9 x 1.4 cm = volume: 5 mL. Normal
appearance/no adnexal mass.

Pulsed Doppler evaluation of both ovaries demonstrates normal
low-resistance arterial and venous waveforms.

Other findings

Moderate volume free fluid within the right adnexa and pelvis.
IMPRESSION: 1. Approximate 4.5 cm complex right ovarian cyst, favored to reflect
a hemorrhagic cyst. Associated moderate volume free fluid within the
adjacent pelvis suggests recent cyst rupture. A short interval
follow-up ultrasound in 6-12 weeks to ensure that this lesion
resolves is suggested.
2. No other acute abnormality within the pelvis. No evidence for
torsion.
